# Patient Record
Sex: Male | Born: 1960 | Race: White | Hispanic: No | Marital: Married | Smoking: Current every day smoker
Health system: Southern US, Community
[De-identification: ages and names within clinical notes are randomized; demographics above are authoritative.]

## PROBLEM LIST (undated history)

## (undated) DIAGNOSIS — R5382 Chronic fatigue, unspecified: Secondary | ICD-10-CM

## (undated) DIAGNOSIS — I10 Essential (primary) hypertension: Secondary | ICD-10-CM

## (undated) DIAGNOSIS — I25118 Atherosclerotic heart disease of native coronary artery with other forms of angina pectoris: Secondary | ICD-10-CM

## (undated) DIAGNOSIS — E559 Vitamin D deficiency, unspecified: Secondary | ICD-10-CM

## (undated) DIAGNOSIS — E291 Testicular hypofunction: Secondary | ICD-10-CM

## (undated) HISTORY — DX: Chronic fatigue, unspecified: R53.82

## (undated) HISTORY — PX: KNEE SURGERY: SHX244

## (undated) HISTORY — PX: OTHER SURGICAL HISTORY: SHX169

## (undated) HISTORY — DX: Essential (primary) hypertension: I10

## (undated) HISTORY — DX: Testicular hypofunction: E29.1

## (undated) HISTORY — DX: Vitamin D deficiency, unspecified: E55.9

## (undated) HISTORY — DX: Atherosclerotic heart disease of native coronary artery with other forms of angina pectoris: I25.118

---

## 2001-01-03 ENCOUNTER — Ambulatory Visit (HOSPITAL_COMMUNITY): Admission: RE | Admit: 2001-01-03 | Discharge: 2001-01-03 | Payer: Self-pay | Admitting: Orthopedic Surgery

## 2001-01-03 ENCOUNTER — Encounter: Payer: Self-pay | Admitting: Orthopedic Surgery

## 2016-02-03 DIAGNOSIS — S82832D Other fracture of upper and lower end of left fibula, subsequent encounter for closed fracture with routine healing: Secondary | ICD-10-CM | POA: Insufficient documentation

## 2016-02-03 HISTORY — DX: Other fracture of upper and lower end of left fibula, subsequent encounter for closed fracture with routine healing: S82.832D

## 2021-02-10 DIAGNOSIS — R0602 Shortness of breath: Secondary | ICD-10-CM

## 2021-02-10 DIAGNOSIS — I10 Essential (primary) hypertension: Secondary | ICD-10-CM | POA: Diagnosis not present

## 2021-02-17 ENCOUNTER — Encounter: Payer: Self-pay | Admitting: *Deleted

## 2021-02-17 ENCOUNTER — Encounter: Payer: Self-pay | Admitting: Cardiology

## 2021-03-19 ENCOUNTER — Encounter: Payer: Self-pay | Admitting: Cardiology

## 2021-03-19 ENCOUNTER — Other Ambulatory Visit: Payer: Self-pay

## 2021-03-19 ENCOUNTER — Ambulatory Visit: Payer: BC Managed Care – PPO | Admitting: Cardiology

## 2021-03-19 VITALS — BP 144/76 | HR 87 | Ht 69.0 in | Wt 246.2 lb

## 2021-03-19 DIAGNOSIS — I700 Atherosclerosis of aorta: Secondary | ICD-10-CM | POA: Diagnosis not present

## 2021-03-19 DIAGNOSIS — J432 Centrilobular emphysema: Secondary | ICD-10-CM | POA: Diagnosis not present

## 2021-03-19 DIAGNOSIS — I2510 Atherosclerotic heart disease of native coronary artery without angina pectoris: Secondary | ICD-10-CM | POA: Diagnosis not present

## 2021-03-19 DIAGNOSIS — I10 Essential (primary) hypertension: Secondary | ICD-10-CM | POA: Diagnosis not present

## 2021-03-19 DIAGNOSIS — R0602 Shortness of breath: Secondary | ICD-10-CM

## 2021-03-19 DIAGNOSIS — M542 Cervicalgia: Secondary | ICD-10-CM

## 2021-03-19 MED ORDER — ROSUVASTATIN CALCIUM 10 MG PO TABS: 10.0000 mg | ORAL_TABLET | Freq: Every day | ORAL | 3 refills | Status: DC

## 2021-03-19 MED ORDER — NITROGLYCERIN 0.4 MG SL SUBL: 0.4000 mg | SUBLINGUAL_TABLET | SUBLINGUAL | 3 refills | Status: DC | PRN

## 2021-03-19 MED ORDER — DILTIAZEM HCL ER COATED BEADS 240 MG PO CP24: 240.0000 mg | ORAL_CAPSULE | Freq: Every day | ORAL | 3 refills | Status: DC

## 2021-03-19 MED ORDER — METOPROLOL TARTRATE 100 MG PO TABS: 100.0000 mg | ORAL_TABLET | Freq: Once | ORAL | 0 refills | Status: DC

## 2021-03-19 MED ORDER — ASPIRIN EC 81 MG PO TBEC: 81.0000 mg | DELAYED_RELEASE_TABLET | Freq: Every day | ORAL | 3 refills | Status: DC

## 2021-03-19 NOTE — Patient Instructions (Addendum)
Medication Instructions:  Your physician has recommended you make the following change in your medication:  START: Aspirin 81 mg take one tablet by mouth daily  START: Rosuvastatin 10 mg take one tablet by mouth daily.  START: Diltiazem 240 mg take one tablet by mouth daily.  START: Nitroglycerin 0.4 mg take one tablet by mouth every 5 minutes as needed for chest pain.  *If you need a refill on your cardiac medications before your next appointment, please call your pharmacy*   Lab Work: Your physician recommends that you return for lab work in: TODAY BMP, ProBNP If you have labs (blood work) drawn today and your tests are completely normal, you will receive your results only by: MyChart Message (if you have MyChart) OR A paper copy in the mail If you have any lab test that is abnormal or we need to change your treatment, we will call you to review the results.   Testing/Procedures:   Your cardiac CT will be scheduled at the below location:   [facility] [address] [place], [place] [address] [phone]  If scheduled at [facility], please arrive at the [facility] main entrance (entrance A) of [facility] 30 minutes prior to test start time. Proceed to the [facility] Radiology Department (first floor) to check-in and test prep.  Please follow these instructions carefully (unless otherwise directed):  On the Night Before the Test: Be sure to Drink plenty of water. Do not consume any caffeinated/decaffeinated beverages or chocolate 12 hours prior to your test. Do not take any antihistamines 12 hours prior to your test.  On the Day of the Test: Drink plenty of water until 1 hour prior to the test. Do not eat any food 4 hours prior to the test. You may take your regular medications prior to the test.  Take metoprolol (Lopressor) two hours prior to test.      After the Test: Drink plenty of water. After receiving IV contrast, you may  experience a mild flushed feeling. This is normal. On occasion, you may experience a mild rash up to 24 hours after the test. This is not dangerous. If this occurs, you can take Benadryl 25 mg and increase your fluid intake. If you experience trouble breathing, this can be serious. If it is severe call 911 IMMEDIATELY. If it is mild, please call our office. If you take any of these medications: Glipizide/Metformin, Avandament, Glucavance, please do not take 48 hours after completing test unless otherwise instructed.  Please allow 2-4 weeks for scheduling of routine cardiac CTs. Some insurance companies require a pre-authorization which may delay scheduling of this test.   For non-scheduling related questions, please contact the cardiac imaging nurse navigator should you have any questions/concerns: Rockwell Alexandria, Cardiac Imaging Nurse Navigator Larey Brick, Cardiac Imaging Nurse Navigator [facility] Direct Office Dial: [phone]   For scheduling needs, including cancellations and rescheduling, please call [place], [phone].    Follow-Up: At [facility], you and your health needs are our priority.  As part of our continuing mission to provide you with exceptional heart care, we have created designated Provider Care Teams.  These Care Teams include your primary Cardiologist (physician) and Advanced Practice Providers (APPs -  Physician Assistants and Nurse Practitioners) who all work together to provide you with the care you need, when you need it.  We recommend signing up for the patient portal called "MyChart".  Sign up information is provided on  this After Visit Summary.  MyChart is used to connect with patients for Virtual Visits (Telemedicine).  Patients are able to view lab/test results, encounter notes, upcoming appointments, etc.  Non-urgent messages can be sent to your provider as well.   To learn more about what you can do with MyChart, go to  [email or web address].    Your next appointment:   6 week(s)  The format for your next appointment:   In Person  Provider:   Norman Herrlich, MD   Other Instructions

## 2021-03-19 NOTE — Progress Notes (Signed)
Cardiology Office Note:    Date:  03/19/2021   ID:  Leroy Hart, DOB [birth year], MRN [id number]  PCP:  Eber Jones, NP  Cardiologist:  Norman Herrlich, MD   Referring MD: Eber Jones, NP  ASSESSMENT:    1. Coronary artery calcification seen on CAT scan   2. Atherosclerosis of aorta (HCC)   3. Primary hypertension   4. Centrilobular emphysema (HCC)   5. Exertional shortness of breath   6. Neck discomfort   7. Shortness of breath    PLAN:    In order of problems listed above:  He has symptoms of exertional shortness of breath and neck discomfort quite suggestive of angina the symptoms were progressive severe limiting and out of proportion to emphysema on CT scan unresponsive above.  He has coronary artery calcification and atherosclerosis of the aorta and I share his concern that this is anginal equivalent.  In the interim we will start aspirin and statin I will put him on a rate limiting calcium channel blocker.  Given a prescription for nitroglycerin.  We will facilitate a cardiac CTA and if he has flow-limiting stenosis will need to consider coronary angiography and revascularization.  In this case, I think a myocardial perfusion study would be helpful and I think we have the option of time before we need to refer him for urgent coronary angiography as the symptoms are severe but stable.  Calcium channel blocker also help him to achieve goals of hypertension treatment.  Next appointment 6 weeks   Medication Adjustments/Labs and Tests Ordered: Current medicines are reviewed at length with the patient today.  Concerns regarding medicines are outlined above.  Orders Placed This Encounter  Procedures   Basic metabolic panel   Pro b natriuretic peptide (BNP)    Meds ordered this encounter  Medications   aspirin EC 81 MG tablet    Sig: Take 1 tablet (81 mg total) by mouth daily. Swallow whole.    Dispense:  90 tablet    Refill:  3   rosuvastatin (CRESTOR) 10 MG  tablet    Sig: Take 1 tablet (10 mg total) by mouth daily.    Dispense:  90 tablet    Refill:  3   diltiazem (CARDIZEM CD) 240 MG 24 hr capsule    Sig: Take 1 capsule (240 mg total) by mouth daily.    Dispense:  90 capsule    Refill:  3   nitroGLYCERIN (NITROSTAT) 0.4 MG SL tablet    Sig: Place 1 tablet (0.4 mg total) under the tongue every 5 (five) minutes as needed for chest pain.    Dispense:  30 tablet    Refill:  3   metoprolol tartrate (LOPRESSOR) 100 MG tablet    Sig: Take 1 tablet (100 mg total) by mouth once for 1 dose. Take two hours prior to your cardiac CT    Dispense:  1 tablet    Refill:  0      Referred for severe exertional shortness of breath as well as neck pressure discomfort  History of Present Illness:    Leroy Hart is a 60 y.o. male who is being seen today for the evaluation of shortness of breath at the request of Eber Jones, NP.  CTA showed emphysema and nodules. [facility]ogram done at [facility]/10/2021 shows ejection fraction low normal 50 to 55% impaired relaxation normal filling pressures to the right ventricle was not well visualized the atria are normal in  size and there was no valvular abnormality seen.  He had chest CT performed 01/21/2021 pulmonary embolism protocol.  He had coronary artery and aortic calcification noted centrilobular pulmonary emphysema small scattered pulmonary nodules EKG performed 01/19/2021 showed sinus rhythm was normal independently reviewed.  He was seen in the emergency room 01/19/2021 for neck swelling heart rate was 88 respiratory rate 18 blood pressure 142/80.  CBC showed hemoglobin of 17.4 potassium 4.0 creatinine 1.00 and was placed on hydrochlorothiazide.  He has smoked for 29 years up to a pack and half per day and continues to smoke.  He has prediabetes family history of premature CAD in his father and hypertension.  He is unsure of his lipid status.  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  Since February has had a progressive increase in exercise intolerance exertional shortness of breath and discomfort in his neck.  He does have some cough and wheezing but was on improvement of bronchodilator.  He also has developed hypertension and presently takes an ARB.  The shortness of breath is very severe and it forces him to stop and rest to recover but no particular chest discomfort he had a disagreement with a neighbor and had severe shortness of breath and pressure in his neck and thought he would need to come back to the hospital again.  He suspects that he has coronary artery disease.  His wife thinks he has sleep apnea he has apneic episodes at night.  He does not have edema orthopnea palpitations or syncope.  I reviewed his testing with him. Past Medical History:  Diagnosis Date   Chronic fatigue    Closed fracture of distal end of left fibula with routine healing 02/03/2016   Essential hypertension    Testicular hypofunction    Vitamin D deficiency     Past Surgical History:  Procedure Laterality Date   DERMATOLOGY     KNEE SURGERY Right     Current Medications: Current Meds  Medication Sig   aspirin EC 81 MG tablet Take 1 tablet (81 mg total) by mouth daily. Swallow whole.   diltiazem (CARDIZEM CD) 240 MG 24 hr capsule Take 1 capsule (240 mg total) by mouth daily.   losartan (COZAAR) 50 MG tablet Take 50 mg by mouth daily.   metoprolol tartrate (LOPRESSOR) 100 MG tablet Take 1 tablet (100 mg total) by mouth once for 1 dose. Take two hours prior to your cardiac CT   nitroGLYCERIN (NITROSTAT) 0.4 MG SL tablet Place 1 tablet (0.4 mg total) under the tongue every 5 (five) minutes as needed for chest pain.   rosuvastatin (CRESTOR) 10 MG tablet Take 1 tablet (10 mg total) by mouth daily.   Vitamin D3 (VITAMIN D) 25 MCG tablet Take 1,000 Units by mouth daily.     Allergies:   Patient has no known allergies.   Social History    Socioeconomic History   Marital status: Married    Spouse name: Not on file   Number of children: Not on file   Years of education: Not on file   Highest education level: Not on file  Occupational History   Not on file  Tobacco Use   Smoking status: Every Day    Packs/day: 1.00    Years: 35.00    Pack years: 35.00    Types: Cigarettes    Passive exposure: Current   Smokeless tobacco: Never  Substance and Sexual Activity   Alcohol  use: Not Currently   Drug use: Never   Sexual activity: Not on file  Other Topics Concern   Not on file  Social History Narrative   Not on file   Social Determinants of Health   Financial Resource Strain: Not on file  Food Insecurity: Not on file  Transportation Needs: Not on file  Physical Activity: Not on file  Stress: Not on file  Social Connections: Not on file     Family History: The patient's family history includes Alzheimer's disease in his paternal grandmother; Breast cancer in his paternal grandmother; Cancer in his maternal grandfather and maternal grandmother; Coronary artery disease in his father; Emphysema in his mother; Heart attack in his paternal grandfather; Hypertension in his father.  ROS:   ROS Please see the history of present illness.     All other systems reviewed and are negative.  EKGs/Labs/Other Studies Reviewed:      Physical Exam:    VS:  BP (!) 144/76 (BP Location: Right Arm, Patient Position: Sitting)   Pulse 87   Ht 5\' 9"  (1.753 m)   Wt 246 lb 3.2 oz (111.7 kg)   SpO2 93%   BMI 36.36 kg/m     Wt Readings from Last 3 Encounters:  03/19/21 246 lb 3.2 oz (111.7 kg)  02/03/21 257 lb (116.6 kg)     GEN: Appears his age well nourished, well developed in no acute distress HEENT: Normal NECK: No JVD; No carotid bruits LYMPHATICS: No lymphadenopathy CARDIAC: RRR, no murmurs, rubs, gallops RESPIRATORY:  Clear to auscultation without rales, wheezing or rhonchi  ABDOMEN: Soft, non-tender,  non-distended MUSCULOSKELETAL:  No edema; No deformity  SKIN: Warm and dry NEUROLOGIC:  Alert and oriented x 3 PSYCHIATRIC:  Normal affect     Signed, 02/05/21, MD  03/19/2021 2:24 PM    [facility]

## 2021-03-20 LAB — BASIC METABOLIC PANEL
BUN/Creatinine Ratio: 17 (ref 9–20)
BUN: 17 mg/dL (ref 6–24)
CO2: 22 mmol/L (ref 20–29)
Calcium: 10.6 mg/dL — ABNORMAL HIGH (ref 8.7–10.2)
Chloride: 108 mmol/L — ABNORMAL HIGH (ref 96–106)
Creatinine, Ser: 0.99 mg/dL (ref 0.76–1.27)
Glucose: 103 mg/dL — ABNORMAL HIGH (ref 65–99)
Potassium: 4.4 mmol/L (ref 3.5–5.2)
Sodium: 153 mmol/L — ABNORMAL HIGH (ref 134–144)
eGFR: 89 mL/min/{1.73_m2} (ref >59)

## 2021-03-20 LAB — PRO B NATRIURETIC PEPTIDE: NT-Pro BNP: 7 pg/mL (ref 0–210)

## 2021-03-22 ENCOUNTER — Telehealth: Payer: Self-pay

## 2021-03-22 DIAGNOSIS — E870 Hyperosmolality and hypernatremia: Secondary | ICD-10-CM

## 2021-03-22 NOTE — Telephone Encounter (Signed)
-----   Message from Baldo Daub, MD sent at 03/21/2021 12:04 PM EDT ----- His serum sodium is elevated this is a very common finding I suspect collaborator and we should repeat his BMP.

## 2021-03-22 NOTE — Telephone Encounter (Signed)
Spoke with patient regarding results and recommendation.  Patient verbalizes understanding and is agreeable to plan of care. Advised patient to call back with any issues or concerns.  

## 2021-03-23 DIAGNOSIS — J432 Centrilobular emphysema: Secondary | ICD-10-CM

## 2021-03-23 DIAGNOSIS — R0609 Other forms of dyspnea: Secondary | ICD-10-CM | POA: Insufficient documentation

## 2021-03-23 DIAGNOSIS — F17210 Nicotine dependence, cigarettes, uncomplicated: Secondary | ICD-10-CM | POA: Insufficient documentation

## 2021-03-23 HISTORY — DX: Centrilobular emphysema: J43.2

## 2021-03-23 HISTORY — DX: Other forms of dyspnea: R06.09

## 2021-03-23 HISTORY — DX: Nicotine dependence, cigarettes, uncomplicated: F17.210

## 2021-03-26 LAB — BASIC METABOLIC PANEL
BUN/Creatinine Ratio: 19 (ref 9–20)
BUN: 16 mg/dL (ref 6–24)
CO2: 21 mmol/L (ref 20–29)
Calcium: 9.9 mg/dL (ref 8.7–10.2)
Chloride: 106 mmol/L (ref 96–106)
Creatinine, Ser: 0.86 mg/dL (ref 0.76–1.27)
Glucose: 102 mg/dL — ABNORMAL HIGH (ref 65–99)
Potassium: 4 mmol/L (ref 3.5–5.2)
Sodium: 140 mmol/L (ref 134–144)
eGFR: 101 mL/min/{1.73_m2} (ref >59)

## 2021-03-29 ENCOUNTER — Telehealth: Payer: Self-pay

## 2021-03-29 NOTE — Telephone Encounter (Signed)
Spoke with patient regarding results and recommendation.  Patient verbalizes understanding and is agreeable to plan of care. Advised patient to call back with any issues or concerns.  

## 2021-03-29 NOTE — Telephone Encounter (Signed)
-----   Message from Baldo Daub, MD sent at 03/28/2021 12:45 PM EDT ----- Normal or stable result  No changes

## 2021-04-22 ENCOUNTER — Telehealth: Payer: Self-pay | Admitting: Cardiology

## 2021-04-22 DIAGNOSIS — I2510 Atherosclerotic heart disease of native coronary artery without angina pectoris: Secondary | ICD-10-CM

## 2021-04-22 NOTE — Telephone Encounter (Signed)
Order has been placed and message was sent to scheduling to call the patient to schedule.

## 2021-04-22 NOTE — Telephone Encounter (Signed)
Patient states he is supposed to have a CT, but has not heard anything about scheduling one and it has been 3 weeks. I did not see an order for the test.

## 2021-04-28 ENCOUNTER — Telehealth (HOSPITAL_COMMUNITY): Payer: Self-pay | Admitting: Emergency Medicine

## 2021-04-28 NOTE — Telephone Encounter (Signed)
Reaching out to patient to offer assistance regarding upcoming cardiac imaging study; pt verbalizes understanding of appt date/time, parking situation and where to check in, pre-test NPO status and medications ordered, and verified current allergies; name and call back number provided for further questions should they arise Liddy Deam RN Navigator Cardiac Imaging [facility] [phone] office [phone] cell   Denies iv issues Denies claustro 100mg metoprolol   

## 2021-04-30 ENCOUNTER — Ambulatory Visit (HOSPITAL_COMMUNITY)
Admission: RE | Admit: 2021-04-30 | Discharge: 2021-04-30 | Disposition: A | Discharge status: Home / Self Care | Payer: BC Managed Care – PPO | Source: Ambulatory Visit | Attending: Cardiology | Admitting: Cardiology

## 2021-04-30 ENCOUNTER — Other Ambulatory Visit: Payer: Self-pay

## 2021-04-30 DIAGNOSIS — Z006 Encounter for examination for normal comparison and control in clinical research program: Secondary | ICD-10-CM

## 2021-04-30 DIAGNOSIS — I2510 Atherosclerotic heart disease of native coronary artery without angina pectoris: Secondary | ICD-10-CM

## 2021-04-30 IMAGING — CT CT HEART MORP W/ CTA COR W/ SCORE W/ CA W/CM &/OR W/O CM
1 of 10 series · 9 of 20 positions shown, 12 images · IV contrast (APPLIED)
Comparison: [DATE] CTA chest
COMPARISON: [DATE] CTA chest

Addendum:
EXAM:
OVER-READ INTERPRETATION  CT CHEST

The following report is an over-read performed by radiologist Dr.
KML [REDACTED] on [DATE]. This over-read
does not include interpretation of cardiac or coronary anatomy or
pathology. The coronary CTA interpretation by the cardiologist is
attached.
CLINICAL DATA: Atypical chestpain
Cardiac/Coronary  CTA
TECHNIQUE: The patient was scanned on a Phillips Force scanner.

[Series 12: multiphase ts · axial · 0.39mm/px · z∈[+1175,+1269]mm · 9 of 2930 slices shown, 12 images]
[im 293/2930  vessel]
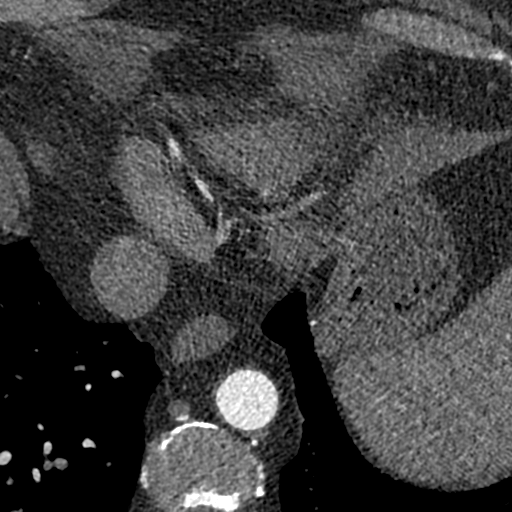
[im 293/2930  lung]
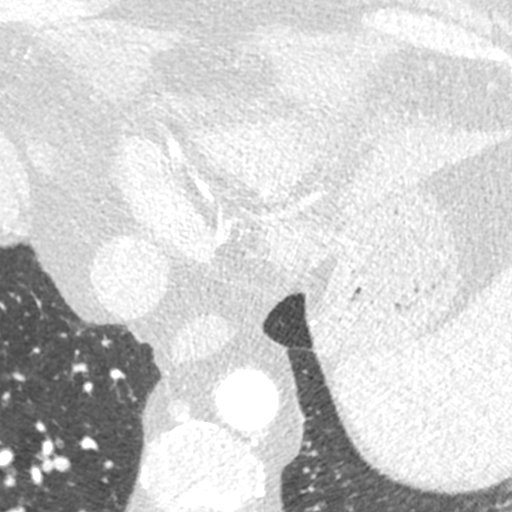
[im 586/2930  vessel]
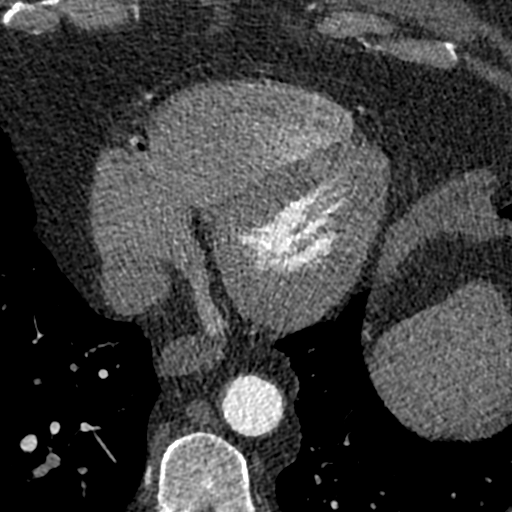
[im 879/2930  vessel]
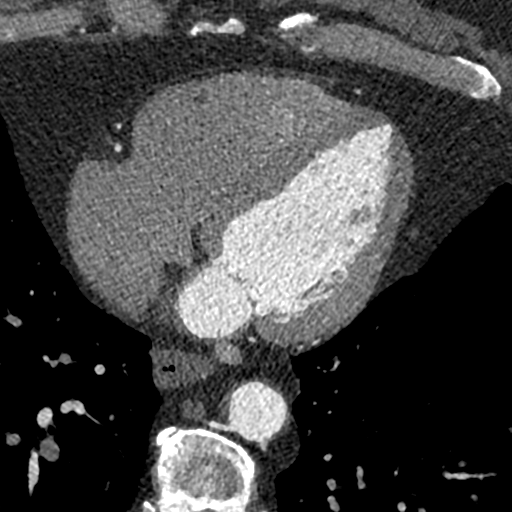
[im 1172/2930  vessel]
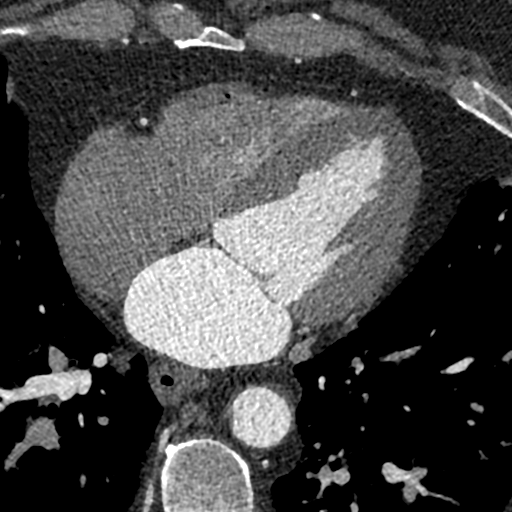
[im 1465/2930  vessel]
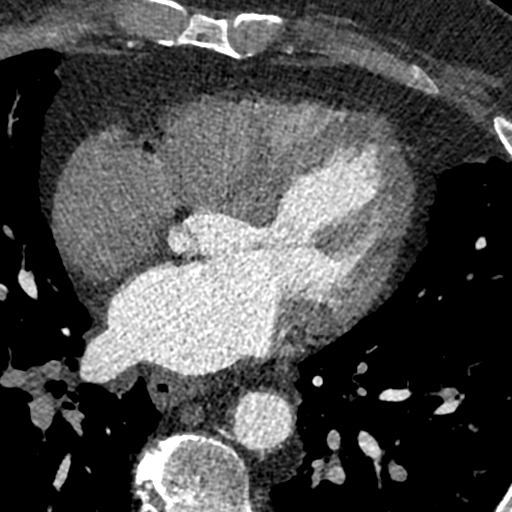
[im 1465/2930  lung]
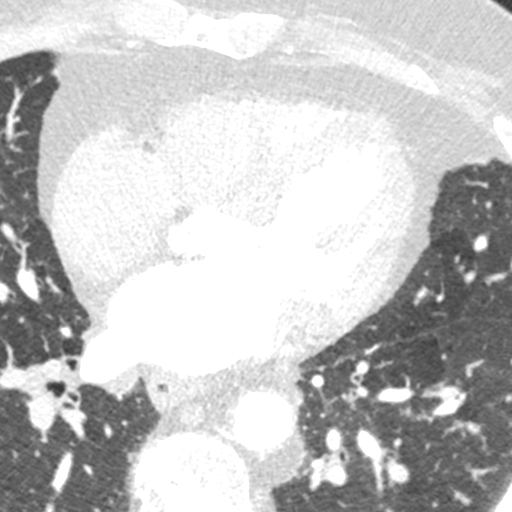
[im 1758/2930  vessel]
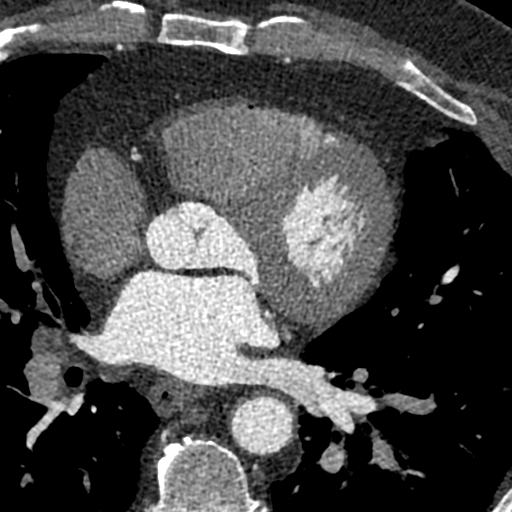
[im 2051/2930  vessel]
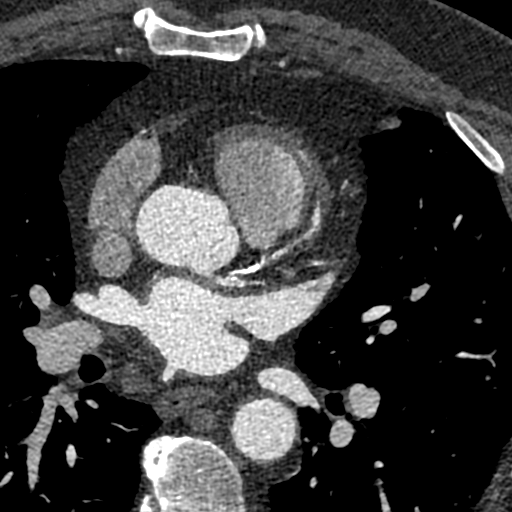
[im 2344/2930  vessel]
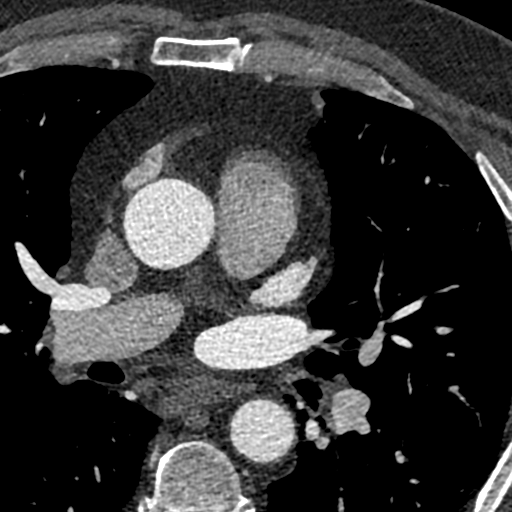
[im 2637/2930  vessel]
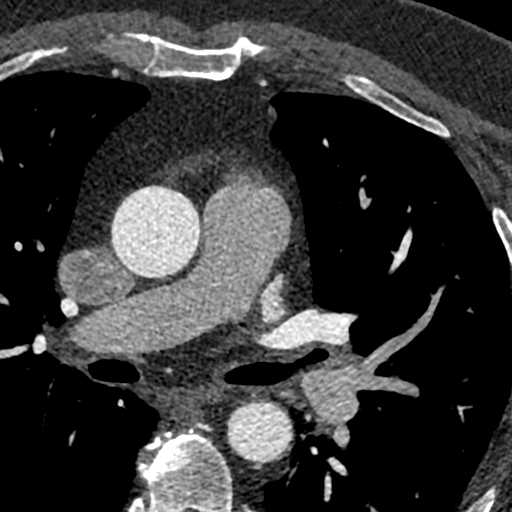
[im 2637/2930  lung]
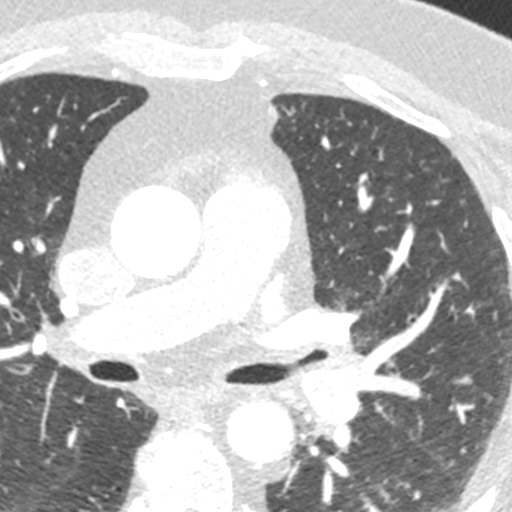

[9 of 20 positions shown; findings below may reference images not displayed]

FINDINGS: Vascular: Aortic atherosclerosis. No central pulmonary embolism, on
this non-dedicated study.

Mediastinum/Nodes: No imaged thoracic adenopathy.

Lungs/Pleura: No pleural fluid. No lobar consolidation. Bilateral
subpleural predominant pulmonary nodules including up to 6 mm are
similar back to [DATE].

Upper Abdomen: Normal imaged portions of the liver, spleen, stomach.

Musculoskeletal: No acute osseous abnormality.
IMPRESSION: 1.  No acute findings in the imaged extracardiac chest.
2.  Aortic Atherosclerosis ([NS]-[NS]).
3. Bilateral pulmonary nodules are subpleural predominant and
similar back for 3 months. Per Fleischner criteria, follow-up chest
CT at 15-21 months is considered optional for low risk patients, but
recommended for high risk patients. This recommendation follows the
consensus statement: Guidelines for Management of Incidental
Pulmonary Nodules Detected on CT Images:From the [HOSPITAL]
[NS]; published online before print (10.1148/radiol.[PHONE_NUMBER]).
FINDINGS: A 120 kV prospective scan was triggered in the descending thoracic
aorta at 111 HU's. Axial non-contrast 3 mm slices were carried out
through the heart. The data set was analyzed on a dedicated work
station and scored using the Agatson method. Gantry rotation speed
was 250 msecs and collimation was .6 mm. No beta blockade and 0.8 mg
of sl NTG was given. The 3D data set was reconstructed in 5%
intervals of the 67-82 % of the R-R cycle. Diastolic phases were
analyzed on a dedicated work station using MPR, MIP and VRT modes.
The patient received 80 cc of contrast.

Aorta: Mildly enlarged ascending aorta - 38 mm. No calcifications.
No dissection.

Aortic Valve:  Trileaflet.  No calcifications.

Coronary Arteries:  Normal coronary origin.  Right dominance.

RCA is a large dominant artery that gives rise to PDA and PLA. There
is soft moderate (50-70% stenosis), plaque in the mid portion of RCA

Left main is a moderate size artery that gives rise to LAD, LCX
arteries as well as to small intermediate branch.

LAD is a large vessel. There is a calcified plaque that extends into
left main coronary artery. This plaque create moderate stenosis
(50-70%) of the proximal portion of the LAD. In the mid portion of
LAD small minimal (0-24% stenosis) non obstructive calcified plaque
is noted. LAD gives rise to moderate size D1. D1 has mild calcified,
non obstructive plaques. Small D2 is noted and is free of disease.

LCX is a non-dominant artery that gives rise to moderate size OM1
branch as well as moderate size OM2. There are soft, mild (25-49%)
non obstructive plaques noted in mid portion of KML

Other findings:

Normal pulmonary vein drainage into the left atrium.

Normal left atrial appendage without a thrombus.

Normal size of the pulmonary artery.
IMPRESSION: 1. Coronary calcium score: Not performed.

2. Normal coronary origin with right dominance.

3. CAD-RADS 3. Moderate stenosis. Consider symptom-guided
anti-ischemic pharmacotherapy as well as risk factor modification
per guideline directed care. Additional analysis with CT FFR will be
submitted.

*** End of Addendum ***
EXAM:
OVER-READ INTERPRETATION  CT CHEST

The following report is an over-read performed by radiologist Dr.
KML [REDACTED] on [DATE]. This over-read
does not include interpretation of cardiac or coronary anatomy or
pathology. The coronary CTA interpretation by the cardiologist is
attached.
FINDINGS: Vascular: Aortic atherosclerosis. No central pulmonary embolism, on
this non-dedicated study.

Mediastinum/Nodes: No imaged thoracic adenopathy.

Lungs/Pleura: No pleural fluid. No lobar consolidation. Bilateral
subpleural predominant pulmonary nodules including up to 6 mm are
similar back to [DATE].

Upper Abdomen: Normal imaged portions of the liver, spleen, stomach.

Musculoskeletal: No acute osseous abnormality.
IMPRESSION: 1.  No acute findings in the imaged extracardiac chest.
2.  Aortic Atherosclerosis ([NS]-[NS]).
3. Bilateral pulmonary nodules are subpleural predominant and
similar back for 3 months. Per Fleischner criteria, follow-up chest
CT at 15-21 months is considered optional for low risk patients, but
recommended for high risk patients. This recommendation follows the
consensus statement: Guidelines for Management of Incidental
Pulmonary Nodules Detected on CT Images:From the [HOSPITAL]
[NS]; published online before print (10.1148/radiol.[PHONE_NUMBER]).

## 2021-04-30 MED ORDER — IOHEXOL 350 MG/ML SOLN
95.0000 mL | Freq: Once | INTRAVENOUS | Status: AC | PRN
  Administered 2021-04-30: 95 mL via INTRAVENOUS

## 2021-04-30 MED ORDER — METOPROLOL TARTRATE 5 MG/5ML IV SOLN
10.0000 mg | INTRAVENOUS | Status: AC | PRN
Start: 2021-04-30 — End: 2021-04-30
  Administered 2021-04-30: 10 mg via INTRAVENOUS

## 2021-04-30 MED ORDER — NITROGLYCERIN 0.4 MG SL SUBL
0.8000 mg | SUBLINGUAL_TABLET | Freq: Once | SUBLINGUAL | Status: AC
  Administered 2021-04-30: 0.8 mg via SUBLINGUAL

## 2021-04-30 MED ORDER — METOPROLOL TARTRATE 5 MG/5ML IV SOLN
INTRAVENOUS | Status: AC
  Administered 2021-04-30: 10 mg via INTRAVENOUS
  Filled 2021-04-30: qty 20

## 2021-04-30 MED ORDER — NITROGLYCERIN 0.4 MG SL SUBL
SUBLINGUAL_TABLET | SUBLINGUAL | Status: AC
  Filled 2021-04-30: qty 2

## 2021-04-30 NOTE — Research (Signed)
IDENTIFY Informed Consent                  Subject Name:Leroy Hart   Subject met inclusion and exclusion criteria.  The informed consent form, study requirements and expectations were reviewed with the subject and questions and concerns were addressed prior to the signing of the consent form.  The subject verbalized understanding of the trial requirements.  The subject agreed to participate in the IDENTIFY trial and signed the informed consent at 11:17AM on 04/30/21.  The informed consent was obtained prior to performance of any protocol-specific procedures for the subject.  A copy of the signed informed consent was given to the subject and a copy was placed in the subject's medical record.    Brenton Grills, Research Assistant

## 2021-05-03 ENCOUNTER — Other Ambulatory Visit (HOSPITAL_COMMUNITY): Payer: Self-pay | Admitting: Emergency Medicine

## 2021-05-03 ENCOUNTER — Telehealth: Payer: Self-pay

## 2021-05-03 ENCOUNTER — Ambulatory Visit (HOSPITAL_COMMUNITY)
Admission: RE | Admit: 2021-05-03 | Discharge: 2021-05-03 | Disposition: A | Discharge status: Home / Self Care | Payer: BC Managed Care – PPO | Source: Ambulatory Visit | Attending: Internal Medicine | Admitting: Internal Medicine

## 2021-05-03 DIAGNOSIS — R931 Abnormal findings on diagnostic imaging of heart and coronary circulation: Secondary | ICD-10-CM | POA: Diagnosis present

## 2021-05-03 DIAGNOSIS — I2510 Atherosclerotic heart disease of native coronary artery without angina pectoris: Secondary | ICD-10-CM | POA: Diagnosis not present

## 2021-05-03 DIAGNOSIS — R079 Chest pain, unspecified: Secondary | ICD-10-CM | POA: Diagnosis present

## 2021-05-03 NOTE — Telephone Encounter (Signed)
Spoke with patient regarding results and recommendation.  Patient verbalizes understanding and is agreeable to plan of care. Advised patient to call back with any issues or concerns.  

## 2021-05-03 NOTE — Telephone Encounter (Signed)
-----   Message from Baldo Daub, MD sent at 05/02/2021 11:45 AM EDT ----- He is on the right medications  We were correct that he had blockages of his arteries  I really need to see the results of the second set of images FFR and then make a decision whether we should do heart catheterization or continue medications.  We can discuss further at office follow-up.

## 2021-05-04 DIAGNOSIS — G4733 Obstructive sleep apnea (adult) (pediatric): Secondary | ICD-10-CM | POA: Insufficient documentation

## 2021-05-04 HISTORY — DX: Obstructive sleep apnea (adult) (pediatric): G47.33

## 2021-05-06 ENCOUNTER — Ambulatory Visit: Payer: BC Managed Care – PPO | Admitting: Cardiology

## 2021-05-06 ENCOUNTER — Other Ambulatory Visit: Payer: Self-pay

## 2021-05-06 ENCOUNTER — Encounter: Payer: Self-pay | Admitting: Cardiology

## 2021-05-06 VITALS — BP 148/80 | HR 86 | Ht 69.0 in | Wt 244.6 lb

## 2021-05-06 DIAGNOSIS — J432 Centrilobular emphysema: Secondary | ICD-10-CM | POA: Diagnosis not present

## 2021-05-06 DIAGNOSIS — I10 Essential (primary) hypertension: Secondary | ICD-10-CM | POA: Diagnosis not present

## 2021-05-06 DIAGNOSIS — I25118 Atherosclerotic heart disease of native coronary artery with other forms of angina pectoris: Secondary | ICD-10-CM | POA: Diagnosis not present

## 2021-05-06 NOTE — Patient Instructions (Signed)
Medication Instructions:  Your physician recommends that you continue on your current medications as directed. Please refer to the Current Medication list given to you today.  *If you need a refill on your cardiac medications before your next appointment, please call your pharmacy*   Lab Work: Your physician recommends that you return for lab work in: TODAY CBC, BMP If you have labs (blood work) drawn today and your tests are completely normal, you will receive your results only by: MyChart Message (if you have MyChart) OR A paper copy in the mail If you have any lab test that is abnormal or we need to change your treatment, we will call you to review the results.   Testing/Procedures:  [facility] AT [place] [address] [place] [address] Dept: [phone] Loc: [phone]  Leroy Hart  05/06/2021  You are scheduled for a Cardiac Catheterization on Monday, September 19 with Dr. Bryan Lemma.  1. Please arrive at the [facility] (Main Entrance A) at [facility]: [address] [place], [place] [address] at 5:30 AM (This time is two hours before your procedure to ensure your preparation). Free valet parking service is available.   Special note: Every effort is made to have your procedure done on time. Please understand that emergencies sometimes delay scheduled procedures.  2. Diet: Do not eat solid foods after midnight.  The patient may have clear liquids until 5am upon the day of the procedure.  3. Labs: YOU HAD YOUR LABS DRAWN TODAY 05/06/2021  4. Medication instructions in preparation for your procedure:   Contrast Allergy: No  On the morning of your procedure, take your Aspirin and any morning medicines NOT listed above.  You may use sips of water.  5. Plan for one night stay--bring personal belongings. 6. Bring a current list of your medications and current insurance  cards. 7. You MUST have a responsible person to drive you home. 8. Someone MUST be with you the first 24 hours after you arrive home or your discharge will be delayed. 9. Please wear clothes that are easy to get on and off and wear slip-on shoes.  Thank you for allowing [place] to care for you!   --  Invasive Cardiovascular services    Follow-Up: At [facility], you and your health needs are our priority.  As part of our continuing mission to provide you with exceptional heart care, we have created designated Provider Care Teams.  These Care Teams include your primary Cardiologist (physician) and Advanced Practice Providers (APPs -  Physician Assistants and Nurse Practitioners) who all work together to provide you with the care you need, when you need it.  We recommend signing up for the patient portal called "MyChart".  Sign up information is provided on this After Visit Summary.  MyChart is used to connect with patients for Virtual Visits (Telemedicine).  Patients are able to view lab/test results, encounter notes, upcoming appointments, etc.  Non-urgent messages can be sent to your provider as well.   To learn more about what you can do with MyChart, go to [email or web address].    Your next appointment:   4 week(s)  The format for your next appointment:   In Person  Provider:   Norman Herrlich, MD   Other Instructions

## 2021-05-06 NOTE — H&P (View-Only) (Signed)
Cardiology Office Note:    Date:  05/06/2021   ID:  Leroy Hart, DOB [birth year], MRN [id number]  PCP:  Eber Jones, NP  Cardiologist:  Norman Herrlich, MD    Referring MD: Eber Jones, NP    ASSESSMENT:    1. Coronary artery disease of native artery of native heart with stable angina pectoris (HCC)   2. Primary hypertension   3. Centrilobular emphysema (HCC)    PLAN:    In order of problems listed above:  Cardiac CTA is abnormal abnormal FFR right coronary lesion he is having symptoms of angina neck discomfort anginal equivalent shortness of breath out of proportion to his COPD and after discussion with patient benefits risks and options elects undergo coronary intervention if appropriate and coronary angiography.  He has no dye allergy no contraindication to dual antiplatelet therapy and is compliant with medications. Stable continue current treatment Continue lipid-lowering treatment   Next appointment: 4 weeks   Medication Adjustments/Labs and Tests Ordered: Current medicines are reviewed at length with the patient today.  Concerns regarding medicines are outlined above.  Orders Placed This Encounter  Procedures   Basic metabolic panel   CBC   EKG 12-Lead   No orders of the defined types were placed in this encounter.   Chief Complaint  Patient presents with   Follow-up   Coronary Artery Disease     History of Present Illness:    Leroy Hart is a 60 y.o. male with a hx of coronary artery calcification on CT scan ongoing cigarette smoking family history of premature CAD last seen 03/19/2021 with symptoms of exertional shortness of breath and neck discomfort anginal in nature out of proportion to emphysema seen on CT scan.  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  At home when he walks to his [place] [address] slightly so short of breath he is sit down to rest 5 to 10 minutes to recover.  His previous exertional neck discomfort is not occurring at this time he is compliant with medications including aspirin rate limiting calcium channel blocker and his high intensity statin.  He has no edema orthopnea palpitation or syncope.  The proBNP level was low at 7. Cardiac CTA was abnormal and calcium score was not performed.  He had stenosis present right coronary artery 50 to 70% stenosis in the midportion LAD 50 to 70% proximal stenosis left circumflex 25 to 49% stenosis.  With concerns of flow-limiting stenosis FFR was performed and the lesion present in the midportion of the right coronary artery have borderline characteristics with FFR of 0.79 and a recommendation if symptomatic to undergo coronary angiography.  Previous testing at [facility]  CTA showed emphysema and nodules. Echocardiogram done at [facility] 02/10/2021 shows ejection fraction low normal 50 to 55% impaired relaxation normal filling pressures to the right ventricle was not well visualized the atria are normal in size and there was no valvular abnormality seen.  He had chest CT performed 01/21/2021 pulmonary embolism protocol.  He had coronary artery and aortic calcification noted centrilobular pulmonary emphysema small scattered pulmonary nodules   Past Surgical History:  Procedure Laterality Date   DERMATOLOGY     KNEE SURGERY Right     Current  Medications: Current Meds  Medication Sig   albuterol (VENTOLIN HFA) 108 (90 Base) MCG/ACT inhaler Inhale into the lungs every 6 (six) hours as needed for wheezing or shortness of breath.   aspirin EC 81 MG tablet Take 1 tablet (81 mg total) by mouth daily. Swallow whole.   diltiazem (CARDIZEM CD) 240 MG 24 hr capsule Take 1 capsule (240 mg total) by mouth daily.    losartan (COZAAR) 50 MG tablet Take 50 mg by mouth daily.   nitroGLYCERIN (NITROSTAT) 0.4 MG SL tablet Place 1 tablet (0.4 mg total) under the tongue every 5 (five) minutes as needed for chest pain.   rosuvastatin (CRESTOR) 10 MG tablet Take 1 tablet (10 mg total) by mouth daily.   Vitamin D3 (VITAMIN D) 25 MCG tablet Take 1,000 Units by mouth daily.     Allergies:   Patient has no known allergies.   Social History   Socioeconomic History   Marital status: Married    Spouse name: Not on file   Number of children: Not on file   Years of education: Not on file   Highest education level: Not on file  Occupational History   Not on file  Tobacco Use   Smoking status: Every Day    Packs/day: 1.00    Years: 35.00    Pack years: 35.00    Types: Cigarettes    Passive exposure: Current   Smokeless tobacco: Never  Substance and Sexual Activity   Alcohol use: Not Currently   Drug use: Never   Sexual activity: Not on file  Other Topics Concern   Not on file  Social History Narrative   Not on file   Social Determinants of Health   Financial Resource Strain: Not on file  Food Insecurity: Not on file  Transportation Needs: Not on file  Physical Activity: Not on file  Stress: Not on file  Social Connections: Not on file     Family History: The patient's family history includes Alzheimer's disease in his paternal grandmother; Breast cancer in his paternal grandmother; Cancer in his maternal grandfather and maternal grandmother; Coronary artery disease in his father; Emphysema in his mother; Heart attack in his paternal grandfather; Hypertension in his father. ROS:   Please see the history of present illness.    All other systems reviewed and are negative.  EKGs/Labs/Other Studies Reviewed:    The following studies were reviewed today:    Recent Labs: 03/19/2021: NT-Pro BNP 7 03/26/2021: BUN 16; Creatinine, Ser 0.86; Potassium 4.0; Sodium 140  Recent Lipid Panel No results  found for: CHOL, TRIG, HDL, CHOLHDL, VLDL, LDLCALC, LDLDIRECT  Physical Exam:    VS:  BP (!) 148/80   Pulse 86   Ht 5\' 9"  (1.753 m)   Wt 244 lb 9.6 oz (110.9 kg)   SpO2 95%   BMI 36.12 kg/m     Wt Readings from Last 3 Encounters:  05/06/21 244 lb 9.6 oz (110.9 kg)  03/19/21 246 lb 3.2 oz (111.7 kg)  02/03/21 257 lb (116.6 kg)     GEN: He appears his age well nourished, well developed in no acute distress HEENT: Normal NECK: No JVD; No carotid bruits LYMPHATICS: No lymphadenopathy CARDIAC: Barrel chest with diminished breath sounds RRR, no murmurs, rubs, gallops RESPIRATORY:  Clear to auscultation without rales, wheezing or rhonchi  ABDOMEN: Soft, non-tender, non-distended MUSCULOSKELETAL:  No edema; No deformity  SKIN: Warm and dry NEUROLOGIC:  Alert and oriented x 3 PSYCHIATRIC:  Normal affect  Signed, Norman Herrlich, MD  05/06/2021 8:21 AM    [facility]

## 2021-05-06 NOTE — Progress Notes (Signed)
Cardiology Office Note:    Date:  05/06/2021   ID:  Leroy Hart, DOB 12/27/1960, MRN [id number]  PCP:  Inman, Joanna R, NP  Cardiologist:  Deavon Podgorski, MD    Referring MD: Inman, Joanna R, NP    ASSESSMENT:    1. Coronary artery disease of native artery of native heart with stable angina pectoris (HCC)   2. Primary hypertension   3. Centrilobular emphysema (HCC)    PLAN:    In order of problems listed above:  Cardiac CTA is abnormal abnormal FFR right coronary lesion he is having symptoms of angina neck discomfort anginal equivalent shortness of breath out of proportion to his COPD and after discussion with patient benefits risks and options elects undergo coronary intervention if appropriate and coronary angiography.  He has no dye allergy no contraindication to dual antiplatelet therapy and is compliant with medications. Stable continue current treatment Continue lipid-lowering treatment   Next appointment: 4 weeks   Medication Adjustments/Labs and Tests Ordered: Current medicines are reviewed at length with the patient today.  Concerns regarding medicines are outlined above.  Orders Placed This Encounter  Procedures   Basic metabolic panel   CBC   EKG 12-Lead   No orders of the defined types were placed in this encounter.   Chief Complaint  Patient presents with   Follow-up   Coronary Artery Disease     History of Present Illness:    Leroy Hart is a 59 y.o. male with a hx of coronary artery calcification on CT scan ongoing cigarette smoking family history of premature CAD last seen 03/19/2021 with symptoms of exertional shortness of breath and neck discomfort anginal in nature out of proportion to emphysema seen on CT scan.  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  At home when he walks to his [place] slightly so short of breath he is sit down to rest 5 to 10 minutes to recover.  His previous exertional neck discomfort is not occurring at this time he is compliant with medications including aspirin rate limiting calcium channel blocker and his high intensity statin.  He has no edema orthopnea palpitation or syncope.  The proBNP level was low at 7. Cardiac CTA was abnormal and calcium score was not performed.  He had stenosis present right coronary artery 50 to 70% stenosis in the midportion LAD 50 to 70% proximal stenosis left circumflex 25 to 49% stenosis.  With concerns of flow-limiting stenosis FFR was performed and the lesion present in the midportion of the right coronary artery have borderline characteristics with FFR of 0.79 and a recommendation if symptomatic to undergo coronary angiography.  Previous testing at [facility]  CTA showed emphysema and nodules. Echocardiogram done at [facility] 02/10/2021 shows ejection fraction low normal 50 to 55% impaired relaxation normal filling pressures to the right ventricle was not well visualized the atria are normal in size and there was no valvular abnormality seen.  He had chest CT performed 01/21/2021 pulmonary embolism protocol.  He had coronary artery and aortic calcification noted centrilobular pulmonary emphysema small scattered pulmonary nodules   Past Surgical History:  Procedure Laterality Date   DERMATOLOGY     KNEE SURGERY Right     Current   Medications: Current Meds  Medication Sig   albuterol (VENTOLIN HFA) 108 (90 Base) MCG/ACT inhaler Inhale into the lungs every 6 (six) hours as needed for wheezing or shortness of breath.   aspirin EC 81 MG tablet Take 1 tablet (81 mg total) by mouth daily. Swallow whole.   diltiazem (CARDIZEM CD) 240 MG 24 hr capsule Take 1 capsule (240 mg total) by mouth daily.    losartan (COZAAR) 50 MG tablet Take 50 mg by mouth daily.   nitroGLYCERIN (NITROSTAT) 0.4 MG SL tablet Place 1 tablet (0.4 mg total) under the tongue every 5 (five) minutes as needed for chest pain.   rosuvastatin (CRESTOR) 10 MG tablet Take 1 tablet (10 mg total) by mouth daily.   Vitamin D3 (VITAMIN D) 25 MCG tablet Take 1,000 Units by mouth daily.     Allergies:   Patient has no known allergies.   Social History   Socioeconomic History   Marital status: Married    Spouse name: Not on file   Number of children: Not on file   Years of education: Not on file   Highest education level: Not on file  Occupational History   Not on file  Tobacco Use   Smoking status: Every Day    Packs/day: 1.00    Years: 35.00    Pack years: 35.00    Types: Cigarettes    Passive exposure: Current   Smokeless tobacco: Never  Substance and Sexual Activity   Alcohol use: Not Currently   Drug use: Never   Sexual activity: Not on file  Other Topics Concern   Not on file  Social History Narrative   Not on file   Social Determinants of Health   Financial Resource Strain: Not on file  Food Insecurity: Not on file  Transportation Needs: Not on file  Physical Activity: Not on file  Stress: Not on file  Social Connections: Not on file     Family History: The patient's family history includes Alzheimer's disease in his paternal grandmother; Breast cancer in his paternal grandmother; Cancer in his maternal grandfather and maternal grandmother; Coronary artery disease in his father; Emphysema in his mother; Heart attack in his paternal grandfather; Hypertension in his father. ROS:   Please see the history of present illness.    All other systems reviewed and are negative.  EKGs/Labs/Other Studies Reviewed:    The following studies were reviewed today:    Recent Labs: 03/19/2021: NT-Pro BNP 7 03/26/2021: BUN 16; Creatinine, Ser 0.86; Potassium 4.0; Sodium 140  Recent Lipid Panel No results  found for: CHOL, TRIG, HDL, CHOLHDL, VLDL, LDLCALC, LDLDIRECT  Physical Exam:    VS:  BP (!) 148/80   Pulse 86   Ht 5\' 9"  (1.753 m)   Wt 244 lb 9.6 oz (110.9 kg)   SpO2 95%   BMI 36.12 kg/m     Wt Readings from Last 3 Encounters:  05/06/21 244 lb 9.6 oz (110.9 kg)  03/19/21 246 lb 3.2 oz (111.7 kg)  02/03/21 257 lb (116.6 kg)     GEN: He appears his age well nourished, well developed in no acute distress HEENT: Normal NECK: No JVD; No carotid bruits LYMPHATICS: No lymphadenopathy CARDIAC: Barrel chest with diminished breath sounds RRR, no murmurs, rubs, gallops RESPIRATORY:  Clear to auscultation without rales, wheezing or rhonchi  ABDOMEN: Soft, non-tender, non-distended MUSCULOSKELETAL:  No edema; No deformity  SKIN: Warm and dry NEUROLOGIC:  Alert and oriented x 3 PSYCHIATRIC:  Normal affect  Signed, Britny Riel, MD  05/06/2021 8:21 AM    [facility]   

## 2021-05-07 LAB — LIPID PANEL
Chol/HDL Ratio: 4.7 ratio (ref 0.0–5.0)
Cholesterol, Total: 159 mg/dL (ref 100–199)
HDL: 34 mg/dL — ABNORMAL LOW (ref >39)
LDL Chol Calc (NIH): 71 mg/dL (ref 0–99)
Triglycerides: 340 mg/dL — ABNORMAL HIGH (ref 0–149)
VLDL Cholesterol Cal: 54 mg/dL — ABNORMAL HIGH (ref 5–40)

## 2021-05-07 LAB — BASIC METABOLIC PANEL
BUN/Creatinine Ratio: 17 (ref 9–20)
BUN: 17 mg/dL (ref 6–24)
CO2: 23 mmol/L (ref 20–29)
Calcium: 10.6 mg/dL — ABNORMAL HIGH (ref 8.7–10.2)
Chloride: 102 mmol/L (ref 96–106)
Creatinine, Ser: 0.99 mg/dL (ref 0.76–1.27)
Glucose: 99 mg/dL (ref 65–99)
Potassium: 4.5 mmol/L (ref 3.5–5.2)
Sodium: 141 mmol/L (ref 134–144)
eGFR: 89 mL/min/{1.73_m2} (ref >59)

## 2021-05-07 LAB — CBC
Hematocrit: 48 % (ref 37.5–51.0)
Hemoglobin: 16.1 g/dL (ref 13.0–17.7)
MCH: 27.9 pg (ref 26.6–33.0)
MCHC: 33.5 g/dL (ref 31.5–35.7)
MCV: 83 fL (ref 79–97)
Platelets: 141 10*3/uL — ABNORMAL LOW (ref 150–450)
RBC: 5.77 x10E6/uL (ref 4.14–5.80)
RDW: 16.3 % — ABNORMAL HIGH (ref 11.6–15.4)
WBC: 9.8 10*3/uL (ref 3.4–10.8)

## 2021-05-10 ENCOUNTER — Encounter (HOSPITAL_COMMUNITY): Payer: Self-pay | Admitting: Cardiology

## 2021-05-10 ENCOUNTER — Other Ambulatory Visit: Payer: Self-pay

## 2021-05-10 ENCOUNTER — Other Ambulatory Visit (HOSPITAL_COMMUNITY): Payer: Self-pay

## 2021-05-10 ENCOUNTER — Encounter (HOSPITAL_COMMUNITY)
Admission: RE | Disposition: A | Discharge status: Home / Self Care | Payer: Self-pay | Source: Home / Self Care | Attending: Cardiology

## 2021-05-10 ENCOUNTER — Ambulatory Visit (HOSPITAL_COMMUNITY)
Admission: RE | Admit: 2021-05-10 | Discharge: 2021-05-10 | Disposition: A | Discharge status: Home / Self Care | Payer: BC Managed Care – PPO | Attending: Cardiology | Admitting: Cardiology

## 2021-05-10 ENCOUNTER — Telehealth: Payer: Self-pay

## 2021-05-10 DIAGNOSIS — I209 Angina pectoris, unspecified: Secondary | ICD-10-CM

## 2021-05-10 DIAGNOSIS — I10 Essential (primary) hypertension: Secondary | ICD-10-CM | POA: Insufficient documentation

## 2021-05-10 DIAGNOSIS — R931 Abnormal findings on diagnostic imaging of heart and coronary circulation: Secondary | ICD-10-CM

## 2021-05-10 DIAGNOSIS — Z79899 Other long term (current) drug therapy: Secondary | ICD-10-CM | POA: Insufficient documentation

## 2021-05-10 DIAGNOSIS — Z95820 Peripheral vascular angioplasty status with implants and grafts: Secondary | ICD-10-CM

## 2021-05-10 DIAGNOSIS — E785 Hyperlipidemia, unspecified: Secondary | ICD-10-CM | POA: Insufficient documentation

## 2021-05-10 DIAGNOSIS — Z8249 Family history of ischemic heart disease and other diseases of the circulatory system: Secondary | ICD-10-CM | POA: Insufficient documentation

## 2021-05-10 DIAGNOSIS — I25118 Atherosclerotic heart disease of native coronary artery with other forms of angina pectoris: Secondary | ICD-10-CM

## 2021-05-10 DIAGNOSIS — Z7982 Long term (current) use of aspirin: Secondary | ICD-10-CM | POA: Insufficient documentation

## 2021-05-10 DIAGNOSIS — F17210 Nicotine dependence, cigarettes, uncomplicated: Secondary | ICD-10-CM | POA: Insufficient documentation

## 2021-05-10 DIAGNOSIS — J432 Centrilobular emphysema: Secondary | ICD-10-CM | POA: Insufficient documentation

## 2021-05-10 HISTORY — DX: Angina pectoris, unspecified: I20.9

## 2021-05-10 HISTORY — PX: CORONARY STENT INTERVENTION: CATH118234

## 2021-05-10 HISTORY — PX: INTRAVASCULAR PRESSURE WIRE/FFR STUDY: CATH118243

## 2021-05-10 HISTORY — DX: Abnormal findings on diagnostic imaging of heart and coronary circulation: R93.1

## 2021-05-10 HISTORY — PX: LEFT HEART CATH AND CORONARY ANGIOGRAPHY: CATH118249

## 2021-05-10 LAB — POCT ACTIVATED CLOTTING TIME
Activated Clotting Time: 254 seconds
Activated Clotting Time: 300 seconds

## 2021-05-10 SURGERY — LEFT HEART CATH AND CORONARY ANGIOGRAPHY
Anesthesia: LOCAL

## 2021-05-10 MED ORDER — CLOPIDOGREL BISULFATE 300 MG PO TABS
ORAL_TABLET | ORAL | Status: AC
  Filled 2021-05-10: qty 2

## 2021-05-10 MED ORDER — HEPARIN (PORCINE) IN NACL 1000-0.9 UT/500ML-% IV SOLN
INTRAVENOUS | Status: AC
  Filled 2021-05-10: qty 500

## 2021-05-10 MED ORDER — HEPARIN SODIUM (PORCINE) 1000 UNIT/ML IJ SOLN
INTRAMUSCULAR | Status: DC | PRN
  Administered 2021-05-10: 5500 [IU] via INTRAVENOUS
  Administered 2021-05-10: 4500 [IU] via INTRAVENOUS
  Administered 2021-05-10: 3000 [IU] via INTRAVENOUS

## 2021-05-10 MED ORDER — ROSUVASTATIN CALCIUM 20 MG PO TABS: 20.0000 mg | ORAL_TABLET | Freq: Every day | ORAL | 3 refills | Status: DC

## 2021-05-10 MED ORDER — SODIUM CHLORIDE 0.9% FLUSH: 3.0000 mL | Freq: Two times a day (BID) | INTRAVENOUS | Status: DC

## 2021-05-10 MED ORDER — SODIUM CHLORIDE 0.9 % IV SOLN: 250.0000 mL | INTRAVENOUS | Status: DC | PRN

## 2021-05-10 MED ORDER — SODIUM CHLORIDE 0.9 % WEIGHT BASED INFUSION
3.0000 mL/kg/h | INTRAVENOUS | Status: AC
  Administered 2021-05-10: 3 mL/kg/h via INTRAVENOUS

## 2021-05-10 MED ORDER — FENTANYL CITRATE (PF) 100 MCG/2ML IJ SOLN
INTRAMUSCULAR | Status: DC | PRN
  Administered 2021-05-10: 50 ug via INTRAVENOUS

## 2021-05-10 MED ORDER — LABETALOL HCL 5 MG/ML IV SOLN: 10.0000 mg | INTRAVENOUS | Status: DC | PRN

## 2021-05-10 MED ORDER — SODIUM CHLORIDE 0.9% FLUSH: 3.0000 mL | INTRAVENOUS | Status: DC | PRN

## 2021-05-10 MED ORDER — FENTANYL CITRATE (PF) 100 MCG/2ML IJ SOLN
INTRAMUSCULAR | Status: AC
  Filled 2021-05-10: qty 2

## 2021-05-10 MED ORDER — VERAPAMIL HCL 2.5 MG/ML IV SOLN
INTRAVENOUS | Status: DC | PRN
  Administered 2021-05-10: 10 mL via INTRA_ARTERIAL

## 2021-05-10 MED ORDER — HEPARIN SODIUM (PORCINE) 1000 UNIT/ML IJ SOLN
INTRAMUSCULAR | Status: AC
  Filled 2021-05-10: qty 1

## 2021-05-10 MED ORDER — MIDAZOLAM HCL 2 MG/2ML IJ SOLN
INTRAMUSCULAR | Status: DC | PRN
  Administered 2021-05-10: 1 mg via INTRAVENOUS

## 2021-05-10 MED ORDER — VERAPAMIL HCL 2.5 MG/ML IV SOLN
INTRAVENOUS | Status: AC
  Filled 2021-05-10: qty 2

## 2021-05-10 MED ORDER — HYDRALAZINE HCL 20 MG/ML IJ SOLN: 10.0000 mg | INTRAMUSCULAR | Status: DC | PRN

## 2021-05-10 MED ORDER — ADENOSINE 12 MG/4ML IV SOLN
INTRAVENOUS | Status: AC
  Filled 2021-05-10: qty 16

## 2021-05-10 MED ORDER — SODIUM CHLORIDE 0.9 % WEIGHT BASED INFUSION: 1.0000 mL/kg/h | INTRAVENOUS | Status: DC

## 2021-05-10 MED ORDER — ROSUVASTATIN CALCIUM 10 MG PO TABS: 20.0000 mg | ORAL_TABLET | Freq: Every day | ORAL | 0 refills | Status: DC

## 2021-05-10 MED ORDER — ASPIRIN 81 MG PO CHEW: 81.0000 mg | CHEWABLE_TABLET | ORAL | Status: DC

## 2021-05-10 MED ORDER — ADENOSINE (DIAGNOSTIC) 140MCG/KG/MIN
INTRAVENOUS | Status: DC | PRN
  Administered 2021-05-10: 140 ug/kg/min via INTRAVENOUS

## 2021-05-10 MED ORDER — LIDOCAINE HCL (PF) 1 % IJ SOLN
INTRAMUSCULAR | Status: AC
  Filled 2021-05-10: qty 30

## 2021-05-10 MED ORDER — NITROGLYCERIN 1 MG/10 ML FOR IR/CATH LAB
INTRA_ARTERIAL | Status: AC
  Filled 2021-05-10: qty 10

## 2021-05-10 MED ORDER — ACETAMINOPHEN 325 MG PO TABS
650.0000 mg | ORAL_TABLET | ORAL | Status: DC | PRN
Start: 2021-05-10 — End: 2021-05-10

## 2021-05-10 MED ORDER — NITROGLYCERIN 1 MG/10 ML FOR IR/CATH LAB
INTRA_ARTERIAL | Status: DC | PRN
  Administered 2021-05-10: 200 ug

## 2021-05-10 MED ORDER — CLOPIDOGREL BISULFATE 300 MG PO TABS
ORAL_TABLET | ORAL | Status: DC | PRN
  Administered 2021-05-10: 600 mg via ORAL

## 2021-05-10 MED ORDER — CLOPIDOGREL BISULFATE 75 MG PO TABS: 75.0000 mg | ORAL_TABLET | Freq: Every day | ORAL | Status: DC

## 2021-05-10 MED ORDER — CLOPIDOGREL BISULFATE 75 MG PO TABS
75.0000 mg | ORAL_TABLET | Freq: Every day | ORAL | 3 refills | Status: DC
  Filled 2021-05-10: qty 90, 90d supply, fill #0

## 2021-05-10 MED ORDER — CLOPIDOGREL BISULFATE 75 MG PO TABS: 75.0000 mg | ORAL_TABLET | Freq: Every day | ORAL | 0 refills | Status: DC

## 2021-05-10 MED ORDER — ONDANSETRON HCL 4 MG/2ML IJ SOLN: 4.0000 mg | Freq: Four times a day (QID) | INTRAMUSCULAR | Status: DC | PRN

## 2021-05-10 MED ORDER — LIDOCAINE HCL (PF) 1 % IJ SOLN
INTRAMUSCULAR | Status: DC | PRN
  Administered 2021-05-10: 2 mL

## 2021-05-10 MED ORDER — MIDAZOLAM HCL 2 MG/2ML IJ SOLN
INTRAMUSCULAR | Status: AC
  Filled 2021-05-10: qty 2

## 2021-05-10 MED ORDER — HEPARIN (PORCINE) IN NACL 1000-0.9 UT/500ML-% IV SOLN
INTRAVENOUS | Status: DC | PRN
  Administered 2021-05-10 (×2): 500 mL

## 2021-05-10 MED ORDER — IOHEXOL 350 MG/ML SOLN
INTRAVENOUS | Status: DC | PRN
  Administered 2021-05-10: 165 mL

## 2021-05-10 MED ORDER — ACETAMINOPHEN 325 MG PO TABS: 650.0000 mg | ORAL_TABLET | ORAL | Status: DC | PRN

## 2021-05-10 SURGICAL SUPPLY — 21 items
BALLN SAPPHIRE 2.25X15 (BALLOONS) ×2
BALLOON SAPPHIRE 2.25X15 (BALLOONS) IMPLANT
CATH OPTITORQUE TIG 4.0 5F (CATHETERS) ×1 IMPLANT
CATH VISTA GUIDE 6FR JR4 (CATHETERS) ×1 IMPLANT
CATH VISTA GUIDE 6FR XBLAD3.5 (CATHETERS) ×1 IMPLANT
DEVICE RAD COMP TR BAND LRG (VASCULAR PRODUCTS) ×1 IMPLANT
ELECT DEFIB PAD ADLT CADENCE (PAD) ×1 IMPLANT
GLIDESHEATH SLEND SS 6F .021 (SHEATH) ×1 IMPLANT
GUIDEWIRE INQWIRE 1.5J.035X260 (WIRE) IMPLANT
GUIDEWIRE PRESSURE X 175 (WIRE) ×1 IMPLANT
INQWIRE 1.5J .035X260CM (WIRE) ×2
KIT ENCORE 26 ADVANTAGE (KITS) ×1 IMPLANT
KIT ESSENTIALS PG (KITS) ×1 IMPLANT
KIT HEART LEFT (KITS) ×2 IMPLANT
PACK CARDIAC CATHETERIZATION (CUSTOM PROCEDURE TRAY) ×2 IMPLANT
SHEATH PROBE COVER 6X72 (BAG) ×1 IMPLANT
STENT SYNERGY XD 2.50X48 (Permanent Stent) IMPLANT
SYNERGY XD 2.50X48 (Permanent Stent) ×2 IMPLANT
TRANSDUCER W/STOPCOCK (MISCELLANEOUS) ×2 IMPLANT
TUBING CIL FLEX 10 FLL-RA (TUBING) ×2 IMPLANT
WIRE ASAHI PROWATER 180CM (WIRE) ×1 IMPLANT

## 2021-05-10 NOTE — Discharge Instructions (Signed)

## 2021-05-10 NOTE — Discharge Summary (Signed)
Discharge Summary for Same Day PCI   Patient ID: Leroy Hart MRN: [id number]; DOB: 08-27-1960  Admit date: 05/10/2021 Discharge date: 05/10/2021  Primary Care Provider: Eber Jones, NP  Primary Cardiologist: Norman Herrlich, MD  Primary Electrophysiologist:  None   Discharge Diagnoses    Principal Problem:   Abnormal cardiac CT angiography Active Problems:   Angina, class III [facility])   Coronary artery disease of native artery of native heart with stable angina pectoris (HCC)   HLD   HTN   Diagnostic Studies/Procedures    Cardiac Catheterization 05/10/2021:  CORONARY STENT INTERVENTION  INTRAVASCULAR PRESSURE WIRE/FFR STUDY  LEFT HEART CATH AND CORONARY ANGIOGRAPHY   Conclusion      Culprit Lesion Segment: Prox RCA lesion is 65% stenosed.  Prox RCA to Mid RCA lesion is 80% stenosed.   A drug-eluting stent was successfully placed using a SYNERGY XD 2.50X48. ->  Postdilated 2.75 mm   Post intervention, there is a 0% residual stenosis throughout the stented segment.Marland Kitchen   ------------------------------------------------------------   Prox LAD to Mid LAD lesion is 20% stenosed with 40% stenosed side branch in 1st Diag.   Mid LAD lesion is 60% stenosed with 35% stenosed side branch in 2nd Diag. - (RFR 0.91, FFR 0.82 --> Borderline, consistent with CT FFR)   Dist Cx lesion is 40% stenosed with 40% stenosed side branch in 3rd Mrg.   ------------------------------------------------------------   The left ventricular systolic function is normal.  The left ventricular ejection fraction is 55-65% by visual estimate.  LV end diastolic pressure is normal   There is no aortic valve stenosis.   SUMMARY Two-vessel CAD Severe tandem lesions in the proximal to mid RCA (65% and 80%) lesions with documented positivity by CT FFR Successful DES PCI covering both lesions (using a single Synergy DES 2.5 mm x 48 mm postdilated to 2.75 mm.) Moderate mid LAD stenosis (CT FFR estimated 0.82)  -> RFR 0.91-0.92, FFR 0.82 showing consistent borderline/nonischemic findings.  (Treat medically) Minimal disease in the AV groove circumflex Relatively normal LVEF with normal EDP.     RECOMMENDATIONS Discharge to home after PACU -plan same-day discharge-6 hours DAPT x6 months uninterrupted, then continue Plavix monotherapy for at least 2 total years given the extent of RCA stent and LAD disease Increase rosuvastatin 40 mg daily and continue other home medications ->  currently on diltiazem as opposed to beta-blocker, will defer to primary cardiologist    Diagnostic Dominance: Right Intervention    History of Present Illness     Leroy Hart is a 60 y.o. male with hypertension, emphysema with ongoing tobacco smoking and recent abnormal coronary CT presents for cardiac catheterization.  Recently established care with Dr. Dulce Sellar in July 2022 for exertional shortness of breath with neck discomfort.  He had evidence of coronary artery calcification of CT angio of the chest.  His symptoms were concerning for typical angina.  Follow up coronary CT: FINDINGS: FFRct analysis was performed on the original cardiac CT angiogram dataset. Diagrammatic representation of the FFRct analysis is provided in a separate PDF document in PACS. This dictation was created using the PDF document and an interactive 3D model of the results. 3D model is not available in the EMR/PACS. Normal FFR range is >0.80.   1. LM: 0.99   2. LAD: Prox 0.94, mid (distal from the lesion) 0.82, distal 0.72 3. LCX: Prox 0.94, mid 0.85, distal 0.79 4. RCA: Prox 0.99, mid (distal from the lesion) 0.79, distal 0.76   IMPRESSION:  Lesion seen in mid LAD is hemodynamically not significant.   Lesion noted in mid portion of RCA has borderline hemodynamic characteristic.   If pt is symptomatic consider cardiac catheterization.   Cardiac catheterization was arranged for further evaluation.  Hospital Course      The patient underwent cardiac cath as noted above   Two-vessel CAD Severe tandem lesions in the proximal to mid RCA (65% and 80%) lesions with documented positivity by CT FFR Successful DES PCI covering both lesions (using a single Synergy DES 2.5 mm x 48 mm postdilated to 2.75 mm.) Moderate mid LAD stenosis (CT FFR estimated 0.82) -> RFR 0.91-0.92, FFR 0.82 showing consistent borderline/nonischemic findings.  (Treat medically) Minimal disease in the AV groove circumflex   Plan for DAPT with ASA/Plavix for at least 37-months , then Plavix as monotherapy for at least 2 years given extent of RCA stent and LAD disease. The patient was seen by cardiac rehab while in short stay. There were no observed complications post cath. Radial cath site was re-evaluated prior to discharge and found to be stable without any complications. Instructions/precautions regarding cath site care were given prior to discharge.  Leroy Hart was seen by Dr. Herbie Baltimore and determined stable for discharge home. Follow up with our office has been arranged. Medications are listed below. Pertinent changes include addition of Plavix and increase Crestor.   Recommended changing diltiazem to beta-blocker in outpatient setting per primary cardiologist.  _____________  Cath/PCI Registry Performance & Quality Measures: Aspirin prescribed? - Yes ADP Receptor Inhibitor (Plavix/Clopidogrel, Brilinta/Ticagrelor or Effient/Prasugrel) prescribed (includes medically managed patients)? - Yes High Intensity Statin (Lipitor 40-80mg  or Crestor 20-40mg ) prescribed? - Yes For EF <40%, was ACEI/ARB prescribed? - Yes For EF <40%, Aldosterone Antagonist (Spironolactone or Eplerenone) prescribed? - Not Applicable (EF >/= 40%) Cardiac Rehab Phase II ordered (Included Medically managed Patients)? - Yes  _____________   Discharge Vitals Blood pressure (!) 160/84, pulse 73, temperature 97.8 F (36.6 C), temperature source Oral, resp.  rate (!) 9, height 5\' 9"  (1.753 m), weight 111.1 kg, SpO2 98 %.  Filed Weights   05/10/21 0540  Weight: 111.1 kg    Last Labs & Radiologic Studies   _____________  CARDIAC CATHETERIZATION  Result Date: 05/10/2021   Culprit Lesion Segment: Prox RCA lesion is 65% stenosed.  Prox RCA to Mid RCA lesion is 80% stenosed.   A drug-eluting stent was successfully placed using a SYNERGY XD 2.50X48. ->  Postdilated 2.75 mm   Post intervention, there is a 0% residual stenosis throughout the stented segment.05/12/2021   ------------------------------------------------------------   Prox LAD to Mid LAD lesion is 20% stenosed with 40% stenosed side branch in 1st Diag.   Mid LAD lesion is 60% stenosed with 35% stenosed side branch in 2nd Diag. - (RFR 0.91, FFR 0.82 --> Borderline, consistent with CT FFR)   Dist Cx lesion is 40% stenosed with 40% stenosed side branch in 3rd Mrg.   ------------------------------------------------------------   The left ventricular systolic function is normal.  The left ventricular ejection fraction is 55-65% by visual estimate.  LV end diastolic pressure is normal   There is no aortic valve stenosis. SUMMARY Two-vessel CAD Severe tandem lesions in the proximal to mid RCA (65% and 80%) lesions with documented positivity by CT FFR Successful DES PCI covering both lesions (using a single Synergy DES 2.5 mm x 48 mm postdilated to 2.75 mm.) Moderate mid LAD stenosis (CT FFR estimated 0.82) -> RFR 0.91-0.92, FFR 0.82 showing consistent borderline/nonischemic findings.  (  Treat medically) Minimal disease in the AV groove circumflex Relatively normal LVEF with normal EDP. RECOMMENDATIONS Discharge to home after PACU -plan same-day discharge-6 hours DAPT x6 months uninterrupted, then continue Plavix monotherapy for at least 2 total years given the extent of RCA stent and LAD disease Increase rosuvastatin 40 mg daily and continue other home medications -> currently on diltiazem as opposed to beta-blocker,  will defer to primary cardiologist Bryan Lemma, MD  CT CORONARY [facility] W/CTA COR W/SCORE [facility] W/CM &/OR WO/CM  Addendum Date: 04/30/2021   ADDENDUM REPORT: 04/30/2021 18:23 CLINICAL DATA:  Atypical chestpain EXAM: Cardiac/Coronary  CTA TECHNIQUE: The patient was scanned on a [employer]. FINDINGS: A 120 kV prospective scan was triggered in the descending thoracic aorta at 111 HU's. Axial non-contrast 3 mm slices were carried out through the heart. The data set was analyzed on a dedicated work station and scored using the Agatson method. Gantry rotation speed was 250 msecs and collimation was .6 mm. No beta blockade and 0.8 mg of sl NTG was given. The 3D data set was reconstructed in 5% intervals of the 67-82 % of the R-R cycle. Diastolic phases were analyzed on a dedicated work station using MPR, MIP and VRT modes. The patient received 80 cc of contrast. Aorta: Mildly enlarged ascending aorta - 38 mm. No calcifications. No dissection. Aortic Valve:  Trileaflet.  No calcifications. Coronary Arteries:  Normal coronary origin.  Right dominance. RCA is a large dominant artery that gives rise to PDA and PLA. There is soft moderate (50-70% stenosis), plaque in the mid portion of RCA Left main is a moderate size artery that gives rise to LAD, LCX arteries as well as to small intermediate branch. LAD is a large vessel. There is a calcified plaque that extends into left main coronary artery. This plaque create moderate stenosis (50-70%) of the proximal portion of the LAD. In the mid portion of LAD small minimal (0-24% stenosis) non obstructive calcified plaque is noted. LAD gives rise to moderate size D1. D1 has mild calcified, non obstructive plaques. Small D2 is noted and is free of disease. LCX is a non-dominant artery that gives rise to moderate size OM1 branch as well as moderate size OM2. There are soft, mild (25-49%) non obstructive plaques noted in mid portion of CX Other findings: Normal pulmonary  vein drainage into the left atrium. Normal left atrial appendage without a thrombus. Normal size of the pulmonary artery. IMPRESSION: 1. Coronary calcium score: Not performed. 2. Normal coronary origin with right dominance. 3. CAD-RADS 3. Moderate stenosis. Consider symptom-guided anti-ischemic pharmacotherapy as well as risk factor modification per guideline directed care. Additional analysis with CT FFR will be submitted. Electronically Signed   By: Gypsy Balsam M.D.   On: 04/30/2021 18:23   Result Date: 04/30/2021 EXAM: OVER-READ INTERPRETATION  CT CHEST The following report is an over-read performed by radiologist Dr. Jeronimo Greaves of [facility] on 04/30/2021. This over-read does not include interpretation of cardiac or coronary anatomy or pathology. The coronary CTA interpretation by the cardiologist is attached. COMPARISON:  01/21/2021 CTA chest FINDINGS: Vascular: Aortic atherosclerosis. No central pulmonary embolism, on this non-dedicated study. Mediastinum/Nodes: No imaged thoracic adenopathy. Lungs/Pleura: No pleural fluid. No lobar consolidation. Bilateral subpleural predominant pulmonary nodules including up to 6 mm are similar back to 01/19/2021. Upper Abdomen: Normal imaged portions of the liver, spleen, stomach. Musculoskeletal: No acute osseous abnormality. IMPRESSION: 1.  No acute findings in the imaged extracardiac chest. 2.  Aortic Atherosclerosis (  ICD10-I70.0). 3. Bilateral pulmonary nodules are subpleural predominant and similar back for 3 months. Per Fleischner criteria, follow-up chest CT at 15-21 months is considered optional for low risk patients, but recommended for high risk patients. This recommendation follows the consensus statement: Guidelines for Management of Incidental Pulmonary Nodules Detected on CT Images:From the Fleischner Society 2017; published online before print (10.1148/radiol.5427062376). Electronically Signed: By: Jeronimo Greaves M.D. On: 04/30/2021 13:25    CT CORONARY FRACTIONAL FLOW RESERVE DATA PREP  Result Date: 05/03/2021 EXAM: FFRCT ANALYSIS FINDINGS: FFRct analysis was performed on the original cardiac CT angiogram dataset. Diagrammatic representation of the FFRct analysis is provided in a separate PDF document in PACS. This dictation was created using the PDF document and an interactive 3D model of the results. 3D model is not available in the EMR/PACS. Normal FFR range is >0.80. 1. LM: 0.99 2. LAD: Prox 0.94, mid (distal from the lesion) 0.82, distal 0.72 3. LCX: Prox 0.94, mid 0.85, distal 0.79 4. RCA: Prox 0.99, mid (distal from the lesion) 0.79, distal 0.76 IMPRESSION: Lesion seen in mid LAD is hemodynamically not significant. Lesion noted in mid portion of RCA has borderline hemodynamic characteristic. If pt is symptomatic consider cardiac catheterization. Electronically Signed   By: Gypsy Balsam M.D.   On: 05/03/2021 18:01    Disposition   Pt is being discharged home today in good condition.  Follow-up Plans & Appointments     Follow-up Information     Baldo Daub, MD .   Specialties: Cardiology, Radiology Contact information: [address] [place] [address] [phone]                Discharge Instructions     Amb Referral to Cardiac Rehabilitation   Complete by: As directed    Referring to [place] CRP2   Diagnosis: Coronary Stents   After initial evaluation and assessments completed: Virtual Based Care may be provided alone or in conjunction with Phase 2 Cardiac Rehab based on patient barriers.: Yes   Diet - low sodium heart healthy   Complete by: As directed    Discharge instructions   Complete by: As directed    No driving for 48 hours. No lifting over 5 lbs for 1 week. No sexual activity for 1 week. You may return to work on 05/17/21. Keep procedure site clean & dry. If you notice increased pain, swelling, bleeding or pus, call/return!  You may shower, but no soaking baths/hot tubs/pools for 1  week.   Increase activity slowly   Complete by: As directed         Discharge Medications   Allergies as of 05/10/2021   No Known Allergies      Medication List     TAKE these medications    aspirin EC 81 MG tablet Take 1 tablet (81 mg total) by mouth daily. Swallow whole.   clopidogrel 75 MG tablet Commonly known as: Plavix Take 1 tablet (75 mg total) by mouth daily.   diltiazem 240 MG 24 hr capsule Commonly known as: CARDIZEM CD Take 1 capsule (240 mg total) by mouth daily.   losartan 50 MG tablet Commonly known as: COZAAR Take 50 mg by mouth daily.   nitroGLYCERIN 0.4 MG SL tablet Commonly known as: NITROSTAT Place 1 tablet (0.4 mg total) under the tongue every 5 (five) minutes as needed for chest pain.   rosuvastatin 20 MG tablet Commonly known as: Crestor Take 1 tablet (20 mg total) by mouth daily. Start taking on: June 09, 2021 What changed:  medication strength how much to take These instructions start on June 09, 2021. If you are unsure what to do until then, ask your doctor or other care provider.   Spiriva Respimat 2.5 MCG/ACT Aers Generic drug: Tiotropium Bromide Monohydrate Inhale 2.5 mcg into the lungs daily.   Vitamin D3 25 MCG tablet Commonly known as: Vitamin D Take 1,000 Units by mouth daily.          Allergies No Known Allergies  Outstanding Labs/Studies   Lipid panel and LFTS in 6-8 weeks   Duration of Discharge Encounter   Greater than 30 minutes including physician time.  Lorelei Pont, PA 05/10/2021, 2:41 PM

## 2021-05-10 NOTE — Progress Notes (Signed)
Patient and wife was given discharge instructions. Both verbalized understanding. 

## 2021-05-10 NOTE — Interval H&P Note (Signed)
History and Physical Interval Note:  05/10/2021 7:47 AM  Leroy Hart  has presented today for surgery, with the diagnosis of cad - chest pain; abnormal cardiac ct angiography.  The various methods of treatment have been discussed with the patient and family. After consideration of risks, benefits and other options for treatment, the patient has consented to  Procedure(s): LEFT HEART CATH AND CORONARY ANGIOGRAPHY (N/A)  PERCUTANEOUS CORONARY INTERVENTION   as a surgical intervention.  The patient's history has been reviewed, patient examined, no change in status, stable for surgery.  I have reviewed the patient's chart and labs.  Questions were answered to the patient's satisfaction.    Cath Lab Visit (complete for each Cath Lab visit)  Clinical Evaluation Leading to the Procedure:   ACS: No.  Non-ACS:    Anginal Classification: CCS II  Anti-ischemic medical therapy: Minimal Therapy (1 class of medications)  Non-Invasive Test Results: Intermediate-risk stress test findings: cardiac mortality 1-3%/year  Prior CABG: No previous CABG    Bryan Lemma

## 2021-05-10 NOTE — Progress Notes (Signed)
[id number] Education completed with pt who voiced understanding. Stressed importance of plavix with stent. Reviewed NTG use, walking for ex, heart healthy food choices and smoking cessation.. pt is hopeful to quit cold [place]. Gave smoking cessation handout and encouraged to call 1800quitnow. Discussed CRP 2 and referral letter to be sent to [facility]. Luetta Nutting RN BSN 05/10/2021 1:15 PM

## 2021-05-10 NOTE — Telephone Encounter (Signed)
-----   Message from Kardie Tobb, DO sent at 05/09/2021  7:41 PM EDT ----- This is Dr. Munley's patient: LDL 71 ideally should be less than 55, HDL is 34 and triglyceride 340.  I would recommend increasing your Crestor to 10 mg daily.  It would be beneficial to add Vascepa 500 mg twice daily to your regimen as well.  If you like to wait and discuss this with Dr. Munley that will be fine as well. Other labs stable 

## 2021-05-10 NOTE — Brief Op Note (Addendum)
BRIEF CARDIAC CATHETERIZATION / PCI REPORT  05/10/2021 9:40 AM  PCP:  Barnetta Chapel, NP    Cardiologist:  Shirlee More, MD    SURGEON:  Surgeon(s) and Role:    * Leonie Man, MD - Primary  PATIENT:  Leroy Hart  61 y.o. male  Leroy Hart is a 60 y.o. male with a hx of coronary artery calcification on CT scan ongoing cigarette smoking family history of premature CAD last seen 03/19/2021 with symptoms of exertional shortness of breath and neck discomfort anginal in nature out of proportion to emphysema seen on CT scan.  He does note exertional chest pain at rest or walking roughly 250 yards.  Likely consistent with class II angina but somewhat worsening.  He is therefore referred for invasive valuation Cardiac Catheterization possible Percutaneous Coronary intervention.  PRE-OPERATIVE DIAGNOSIS: Abnormal cardiac CT angiogram, class II-III angina  POST-OPERATIVE DIAGNOSIS:   Two-vessel CAD Severe tandem lesions in the proximal to mid RCA (65% and 80%) lesions with documented positivity by CT FFR Successful DES PCI covering both lesions (using a single Synergy DES 2.5 mm x 48 mm postdilated to 2.75 mm.) Moderate mid LAD stenosis (CT FFR estimated 0.82) -> RFR 0.91-0.92, FFR 0.82 showing consistent borderline/nonischemic findings.  (Treat medically) Minimal disease in the AV groove circumflex Relatively normal LVEF with normal EDP.  PROCEDURE:  Procedure(s): LEFT HEART CATH AND CORONARY ANGIOGRAPHY (N/A) INTRAVASCULAR PRESSURE WIRE/FFR STUDY (N/A) CORONARY STENT INTERVENTION (N/A)   Time Out: Verified patient identification, verified procedure, site/side was marked, verified correct patient position, special equipment/implants available, medications/allergies/relevent history reviewed, required imaging and test results available. Performed.  Access:  RIGHT Radial Artery: 6 Fr sheath -- Seldinger technique using Micropuncture Kit -- Direct ultrasound  guidance used.  Permanent image obtained and placed on chart. -- 10 mL radial cocktail IA; 5500 Units IV Heparin  Left Heart Catheterization: 5& 6Fr Catheters advanced or exchanged over a J-wire under direct fluoroscopic guidance into the ascending aorta; TIG 4.0 catheter advanced first.  * LV Hemodynamics (LV Gram): TIG 4.0 Catheter * Left Coronary Artery Cineangiography: TIG 4.0 Catheter  * Right Coronary Artery Cineangiography: TIG 4.0 Catheter   Review of initial angiography revealed:  Moderate mid LAD stenosis at second diagonal branch of borderline significance (60%) Tandem severe 65% and 80% lesions in the proximal and mid RCA as documented on CT angiogram with positive CT FFR  Preparations are made for RFR/FFR measurement of the LAD and PCI of indicated followed by PCI of the RCA  RFR/FFR (Resting Flow Reserve/Fractional Flow Reserve) measurement in the LAD: 6 [place] XB LAD 3.5 guide catheter ; pressure X wire performed -> RFR 0.91-0.92; adenosine infused for 2 minutes-FFR 0.2 (nonspecific) Wire removed, then guide catheter removed over wire without complication  PCI of the RCA performed: See FINDINGS 6 [place] JR4 guide catheter, Prowater wire Initial angioplasty with 2.25 mm x 15 mm standard balloon -> 8 ATM X 20 sec Stent: Synergy DES 2.5 mm x 48 mm -> deployed at 14 ATM x30 sec, postdilated with stent balloon 18 ATM x20 seconds (final diameter 2.75 mm)  Upon completion of Angiogaphy, the catheter was removed completely out of the body over a wire, without complication.  Radial sheath removed in the Cardiac Catheterization lab with TR Band placed for hemostasis.  TR Band: 0915  Hours; 11 mL air  MEDICATIONS SQ Lidocaine 2 mL Radial Cocktail: 3 mg Verapmil in 10 mL NS Heparin: Total 13,000 units  p.o.Plavix 600 mg  IC NTG 200 mcg x 1  EBL:  <50 Ml   COUNTS:  YES  DICTATION: .Note written in EPIC  PLAN OF CARE: Discharge to home after PACU -plan same-day discharge-6  hours DAPT x6 months uninterrupted, then continue Plavix monotherapy for at least 2 total years given the extent of RCA stent and LAD disease Increase rosuvastatin 40 mg daily and continue other home medications ->  currently on diltiazem as opposed to beta-blocker, will defer to primary cardiologist  PATIENT DISPOSITION:  PACU - hemodynamically stable.   Delay start of Pharmacological VTE agent (>24hrs) due to surgical blood loss or risk of bleeding: not applicable  He will follow-up with Dr. Daisy Floro, MD

## 2021-05-10 NOTE — Telephone Encounter (Signed)
Left message on patients voicemail to please return our call.   

## 2021-05-11 ENCOUNTER — Telehealth: Payer: Self-pay

## 2021-05-11 ENCOUNTER — Telehealth (HOSPITAL_COMMUNITY): Payer: Self-pay

## 2021-05-11 MED ORDER — VASCEPA 0.5 G PO CAPS: 4.0000 | ORAL_CAPSULE | Freq: Two times a day (BID) | ORAL | 3 refills | Status: DC

## 2021-05-11 NOTE — Telephone Encounter (Signed)
Spoke with patient regarding results and recommendation.  Patient verbalizes understanding and is agreeable to plan of care. Advised patient to call back with any issues or concerns.  

## 2021-05-11 NOTE — Telephone Encounter (Signed)
-----   Message from Thomasene Ripple, DO sent at 05/09/2021  7:41 PM EDT ----- This is Dr. Hulen Shouts patient: LDL 71 ideally should be less than 55, HDL is 34 and triglyceride 340.  I would recommend increasing your Crestor to 10 mg daily.  It would be beneficial to add Vascepa 500 mg twice daily to your regimen as well.  If you like to wait and discuss this with Dr. Dulce Sellar that will be fine as well. Other labs stable

## 2021-05-11 NOTE — Telephone Encounter (Signed)
Per phase I cardiac rehab, fax cardiac rehab referral to [facility]. °

## 2021-05-12 ENCOUNTER — Telehealth: Payer: Self-pay

## 2021-05-12 NOTE — Telephone Encounter (Signed)
Prior Auth for Leroy Hart is approved from 05/12/21-05/11/22.Confirmation [id number]

## 2021-05-27 ENCOUNTER — Telehealth: Payer: Self-pay | Admitting: Cardiology

## 2021-05-27 MED ORDER — ROSUVASTATIN CALCIUM 20 MG PO TABS: 20.0000 mg | ORAL_TABLET | Freq: Every day | ORAL | 3 refills | Status: DC

## 2021-05-27 NOTE — Telephone Encounter (Signed)
Refill sent in per request.  

## 2021-05-27 NOTE — Telephone Encounter (Signed)
A copy of the referral was sent at this time. This order was placed by Dr. Herbie Baltimore.

## 2021-05-27 NOTE — Telephone Encounter (Signed)
Pt c/o medication issue:  1. Name of Medication:  rosuvastatin (CRESTOR) 20 MG tablet  2. How are you currently taking this medication (dosage and times per day)? 1 tablet a day   3. Are you having a reaction (difficulty breathing--STAT)? No   4. What is your medication issue? Leroy Hart is out of this medication, but the pharmacy still has the 10 MG tablets on file. He is needing a new 20 MG prescription written and sent to the pharmacy.

## 2021-05-27 NOTE — Telephone Encounter (Signed)
New Message:     Pt says he needs a copy of the order for Cardiac Rehab. He says his job needs it for [employer]. Please fax to [phone] Att :Sophia

## 2021-05-28 ENCOUNTER — Other Ambulatory Visit (HOSPITAL_COMMUNITY): Payer: Self-pay

## 2021-05-28 ENCOUNTER — Telehealth (HOSPITAL_COMMUNITY): Payer: Self-pay

## 2021-05-28 NOTE — Telephone Encounter (Signed)
Pharmacy Transitions of Care Follow-up Telephone Call  Date of discharge: 05/10/21  Discharge Diagnosis: angioplasty  How have you been since you were released from the hospital?  Patient has been feeling much better since discharge. No questions about meds at this time.  Medication changes made at discharge:     START taking: clopidogrel (Plavix)  STOP taking: rosuvastatin 10 MG tablet (CRESTOR)   Medication changes verified by the patient? Yes    Medication Accessibility:  Home Pharmacy:  [address] [place]  Was the patient provided with refills on discharged medications? Yes   Have all prescriptions been transferred from [facility] to home pharmacy?  Yes  Is the patient able to afford medications? Has insurance    Medication Review:  CLOPIDOGREL (PLAVIX) Clopidogrel 75 mg once daily.  - Educated patient on expected duration of therapy of ASA with clopidogrel.  - Advised patient of medications to avoid (NSAIDs, ASA)  - Educated that Tylenol (acetaminophen) will be the preferred analgesic to prevent risk of bleeding  - Emphasized importance of monitoring for signs and symptoms of bleeding (abnormal bruising, prolonged bleeding, nose bleeds, bleeding from gums, discolored urine, black tarry stools)  - Advised patient to alert all providers of anticoagulation therapy prior to starting a new medication or having a procedure   Follow-up Appointments:  PCP Hospital f/u appt confirmed? None currently scheduled   Specialist Hospital f/u appt confirmed?  Scheduled to see Dr. Dulce Sellar on 06/08/21 @ Cardiology.   If their condition worsens, is the pt aware to call PCP or go to the Emergency Dept.? yes  Final Patient Assessment: Patient has follow up scheduled and refills at home pharmacy

## 2021-05-31 DIAGNOSIS — E559 Vitamin D deficiency, unspecified: Secondary | ICD-10-CM | POA: Insufficient documentation

## 2021-05-31 DIAGNOSIS — I10 Essential (primary) hypertension: Secondary | ICD-10-CM | POA: Insufficient documentation

## 2021-05-31 DIAGNOSIS — R5382 Chronic fatigue, unspecified: Secondary | ICD-10-CM | POA: Insufficient documentation

## 2021-05-31 DIAGNOSIS — E291 Testicular hypofunction: Secondary | ICD-10-CM | POA: Insufficient documentation

## 2021-05-31 NOTE — Telephone Encounter (Signed)
Spoke to the pharmacy just now and they tell me that they did not receive the prescription that I sent in on Thursday. I gave them a verbal order for this and they said they would get it ready for the patient.   I called the patient to let him know this as well and he verbalizes understanding.    Encouraged patient to call back with any questions or concerns.

## 2021-05-31 NOTE — Telephone Encounter (Signed)
Pt called in and stated that the rosuvastatin (CRESTOR) 20 MG tablet  Was not called in .  I verified with the pharmacy that they did not rec  this med     Best number [phone]

## 2021-06-07 NOTE — Progress Notes (Signed)
Cardiology Office Note:    Date:  06/08/2021   ID:  Leroy Hart, DOB [birth year], MRN [id number]  PCP:  Eber Jones, NP  Cardiologist:  Norman Herrlich, MD    Referring MD: Eber Jones, NP    ASSESSMENT:    1. Coronary artery disease of native artery of native heart with stable angina pectoris (HCC)   2. Primary hypertension   3. Mixed hyperlipidemia   4. Centrilobular emphysema (HCC)    PLAN:    In order of problems listed above:  Markedly improved having no anginal or anginal equivalent exertional shortness of breath after PCI of the right coronary artery continue his dual antiplatelet therapy has plan for at least 2 years rate limiting calcium channel blocker and lipid-lowering with statin and icosapent ethyl.  Referred to cardiac rehabilitation Stable we will trend his blood pressures in cardiac rehab normal is hesitant to take early morning measurement and change treatment Stable continue current lipid-lowering combination Stable in retrospect his exertional dyspnea is cardiac not pulmonary   Next appointment: 4 months   Medication Adjustments/Labs and Tests Ordered: Current medicines are reviewed at length with the patient today.  Concerns regarding medicines are outlined above.  No orders of the defined types were placed in this encounter.  No orders of the defined types were placed in this encounter.   No chief complaint on file.   History of Present Illness:    Leroy Hart is a 60 y.o. male with a hx of CAD on cardiac CTA hypertension hyperlipidemia emphysema last seen 05/06/2021.  He was referred to coronary angiography and had PCI and stent of the proximal and mid right coronary artery he had moderate mid LAD disease with borderline FFR without intervention.  Compliance with diet, lifestyle and medications: yes  He is remarkably improved in a short period of time, his shortness of breath is resolved he has had no chest pain  palpitation or syncope. He is highly motivated will be starting cardiac rehabilitation He tolerates his statin without muscle pain or weakness Is on dual antiplatelet therapy without GI side effects or bleeding clinically.  05/10/2021: Conclusion   Culprit Lesion Segment: Prox RCA lesion is 65% stenosed.  Prox RCA to Mid RCA lesion is 80% stenosed.   A drug-eluting stent was successfully placed using a SYNERGY XD 2.50X48. ->  Postdilated 2.75 mm   Post intervention, there is a 0% residual stenosis throughout the stented segment.Marland Kitchen   ------------------------------------------------------------   Prox LAD to Mid LAD lesion is 20% stenosed with 40% stenosed side branch in 1st Diag.   Mid LAD lesion is 60% stenosed with 35% stenosed side branch in 2nd Diag. - (RFR 0.91, FFR 0.82 --> Borderline, consistent with CT FFR)   Dist Cx lesion is 40% stenosed with 40% stenosed side branch in 3rd Mrg.   ------------------------------------------------------------   The left ventricular systolic function is normal.  The left ventricular ejection fraction is 55-65% by visual estimate.  LV end diastolic pressure is normal   There is no aortic valve stenosis.   SUMMARY Two-vessel CAD Severe tandem lesions in the proximal to mid RCA (65% and 80%) lesions with documented positivity by CT FFR Successful DES PCI covering both lesions (using a single Synergy DES 2.5 mm x 48 mm postdilated to 2.75 mm.) Moderate mid LAD stenosis (CT FFR estimated 0.82) -> RFR 0.91-0.92, FFR 0.82 showing consistent borderline/nonischemic findings.  (Treat medically) Minimal disease in the AV groove circumflex Relatively normal LVEF with  normal EDP. Coronary Diagrams  Diagnostic Dominance: Right Intervention   Past Medical History:  Diagnosis Date   Chronic fatigue    Closed fracture of distal end of left fibula with routine healing 02/03/2016   Essential hypertension    Testicular hypofunction    Vitamin D deficiency      Past Surgical History:  Procedure Laterality Date   CORONARY STENT INTERVENTION N/A 05/10/2021   Procedure: CORONARY STENT INTERVENTION;  Surgeon: Marykay Lex, MD;  Location: [facility] INVASIVE CV LAB;  Service: Cardiovascular;  Laterality: N/A;   DERMATOLOGY     INTRAVASCULAR PRESSURE WIRE/FFR STUDY N/A 05/10/2021   Procedure: INTRAVASCULAR PRESSURE WIRE/FFR STUDY;  Surgeon: Marykay Lex, MD;  Location: [facility] INVASIVE CV LAB;  Service: Cardiovascular;  Laterality: N/A;   KNEE SURGERY Right    LEFT HEART CATH AND CORONARY ANGIOGRAPHY N/A 05/10/2021   Procedure: LEFT HEART CATH AND CORONARY ANGIOGRAPHY;  Surgeon: Marykay Lex, MD;  Location: [facility] INVASIVE CV LAB;  Service: Cardiovascular;  Laterality: N/A;    Current Medications: No outpatient medications have been marked as taking for the 06/08/21 encounter (Appointment) with Baldo Daub, MD.     Allergies:   Patient has no known allergies.   Social History   Socioeconomic History   Marital status: Married    Spouse name: Not on file   Number of children: Not on file   Years of education: Not on file   Highest education level: Not on file  Occupational History   Not on file  Tobacco Use   Smoking status: Every Day    Packs/day: 1.00    Years: 35.00    Pack years: 35.00    Types: Cigarettes    Passive exposure: Current   Smokeless tobacco: Never  Substance and Sexual Activity   Alcohol use: Not Currently   Drug use: Never   Sexual activity: Not on file  Other Topics Concern   Not on file  Social History Narrative   Not on file   Social Determinants of Health   Financial Resource Strain: Not on file  Food Insecurity: Not on file  Transportation Needs: Not on file  Physical Activity: Not on file  Stress: Not on file  Social Connections: Not on file     Family History: The patient's family history includes Alzheimer's disease in his paternal grandmother; Breast cancer in his paternal grandmother; Cancer in  his maternal grandfather and maternal grandmother; Coronary artery disease in his father; Emphysema in his mother; Heart attack in his paternal grandfather; Hypertension in his father. ROS:   Please see the history of present illness.    All other systems reviewed and are negative.  EKGs/Labs/Other Studies Reviewed:    The following studies were reviewed today:   Recent Labs: 03/19/2021: NT-Pro BNP 7 05/06/2021: BUN 17; Creatinine, Ser 0.99; Hemoglobin 16.1; Platelets 141; Potassium 4.5; Sodium 141  Recent Lipid Panel    Component Value Date/Time   CHOL 159 05/06/2021 0833   TRIG 340 (H) 05/06/2021 0833   HDL 34 (L) 05/06/2021 0833   CHOLHDL 4.7 05/06/2021 0833   LDLCALC 71 05/06/2021 0833    Physical Exam:    VS:  There were no vitals taken for this visit.    Wt Readings from Last 3 Encounters:  05/10/21 245 lb (111.1 kg)  05/06/21 244 lb 9.6 oz (110.9 kg)  03/19/21 246 lb 3.2 oz (111.7 kg)     GEN:  Well nourished, well developed in no acute distress  HEENT: Normal NECK: No JVD; No carotid bruits LYMPHATICS: No lymphadenopathy CARDIAC: RRR, no murmurs, rubs, gallops RESPIRATORY:  Clear to auscultation without rales, wheezing or rhonchi  ABDOMEN: Soft, non-tender, non-distended MUSCULOSKELETAL:  No edema; No deformity  SKIN: Warm and dry NEUROLOGIC:  Alert and oriented x 3 PSYCHIATRIC:  Normal affect    Signed, Norman Herrlich, MD  06/08/2021 7:50 AM    [facility]

## 2021-06-08 ENCOUNTER — Ambulatory Visit (INDEPENDENT_AMBULATORY_CARE_PROVIDER_SITE_OTHER): Payer: BC Managed Care – PPO | Admitting: Cardiology

## 2021-06-08 ENCOUNTER — Other Ambulatory Visit: Payer: Self-pay

## 2021-06-08 ENCOUNTER — Telehealth: Payer: Self-pay | Admitting: Cardiology

## 2021-06-08 ENCOUNTER — Encounter: Payer: Self-pay | Admitting: Cardiology

## 2021-06-08 VITALS — BP 146/72 | HR 90 | Ht 69.0 in | Wt 243.0 lb

## 2021-06-08 DIAGNOSIS — I10 Essential (primary) hypertension: Secondary | ICD-10-CM

## 2021-06-08 DIAGNOSIS — I25118 Atherosclerotic heart disease of native coronary artery with other forms of angina pectoris: Secondary | ICD-10-CM | POA: Diagnosis not present

## 2021-06-08 DIAGNOSIS — E782 Mixed hyperlipidemia: Secondary | ICD-10-CM | POA: Diagnosis not present

## 2021-06-08 DIAGNOSIS — J432 Centrilobular emphysema: Secondary | ICD-10-CM | POA: Diagnosis not present

## 2021-06-08 MED ORDER — NITROGLYCERIN 0.4 MG SL SUBL: 0.4000 mg | SUBLINGUAL_TABLET | SUBLINGUAL | 3 refills | Status: DC | PRN

## 2021-06-08 MED ORDER — DILTIAZEM HCL ER COATED BEADS 240 MG PO CP24: 240.0000 mg | ORAL_CAPSULE | Freq: Every day | ORAL | 3 refills | Status: DC

## 2021-06-08 MED ORDER — VASCEPA 0.5 G PO CAPS: 4.0000 | ORAL_CAPSULE | Freq: Two times a day (BID) | ORAL | 3 refills | Status: DC

## 2021-06-08 NOTE — Patient Instructions (Signed)
Medication Instructions:  Your physician recommends that you continue on your current medications as directed. Please refer to the Current Medication list given to you today.  *If you need a refill on your cardiac medications before your next appointment, please call your pharmacy*   Lab Work: None If you have labs (blood work) drawn today and your tests are completely normal, you will receive your results only by: MyChart Message (if you have MyChart) OR A paper copy in the mail If you have any lab test that is abnormal or we need to change your treatment, we will call you to review the results.   Testing/Procedures: None   Follow-Up: At [facility], you and your health needs are our priority.  As part of our continuing mission to provide you with exceptional heart care, we have created designated Provider Care Teams.  These Care Teams include your primary Cardiologist (physician) and Advanced Practice Providers (APPs -  Physician Assistants and Nurse Practitioners) who all work together to provide you with the care you need, when you need it.  We recommend signing up for the patient portal called "MyChart".  Sign up information is provided on this After Visit Summary.  MyChart is used to connect with patients for Virtual Visits (Telemedicine).  Patients are able to view lab/test results, encounter notes, upcoming appointments, etc.  Non-urgent messages can be sent to your provider as well.   To learn more about what you can do with MyChart, go to [email or web address].    Your next appointment:   4 month(s)  The format for your next appointment:   In Person  Provider:   Brian Munley, MD   Other Instructions   

## 2021-06-08 NOTE — Telephone Encounter (Signed)
Spoke to the patient just now and he requested that I sent in this prescription to [facility] in [place] at this time. It has been called in for him.    Encouraged patient to call back with any questions or concerns.

## 2021-06-08 NOTE — Telephone Encounter (Signed)
New Message:      Patient said he just talked to his pharmacist. He was told told that they did not carry Vascepa 0.5 mg, they did have the 1 mg.

## 2021-09-20 ENCOUNTER — Telehealth: Payer: Self-pay | Admitting: Cardiology

## 2021-09-20 NOTE — Telephone Encounter (Signed)
Spoke with pt who states that his BP has been elevated and cardiac rehab will fax his readings. Pt also states that he cannot afford Vacscepa as he lost his insurance. Advised to take 2 gm fish oil until he has insurance.

## 2021-09-20 NOTE — Telephone Encounter (Signed)
Pt c/o BP issue: STAT if pt c/o blurred vision, one-sided weakness or slurred speech  1. What are your last 5 BP readings?   2. Are you having any other symptoms (ex. Dizziness, headache, blurred vision, passed out)?  Low energy  3. What is your BP issue?   Patient states he was recently discharged from the hospital and his systolic # was around 220. He states over the past few days his systolic BP has been extremely elevated.

## 2021-10-21 NOTE — Progress Notes (Signed)
?Cardiology Office Note:   ? ?Date:  10/22/2021  ? ?ID:  Leroy Hart, DOB [birth year], MRN [id number] ? ?PCP:  Leroy Jones, NP  ?Cardiologist:  Leroy Herrlich, MD   ? ?Referring MD: Leroy Jones, NP  ? ? ?ASSESSMENT:   ? ?1. Coronary artery disease of native artery of native heart with stable angina pectoris (HCC)   ?2. Primary hypertension   ?3. Mixed hyperlipidemia   ?4. Centrilobular emphysema (HCC)   ? ?PLAN:   ? ?In order of problems listed above: ? ?He continues to do well having no anginal discomfort after PCI at 1 year we will drop aspirin continue clopidogrel [place] Heart Association class I continue his rate limiting calcium channel blocker antihypertensive and high intensity statin we will go ahead and check a lipid profile today last LDL September 2022 71 ?Stable emphysema ?September we will drop aspirin continue monotherapy with clopidogrel ? ? ?Next appointment: 9 months ? ? ?Medication Adjustments/Labs and Tests Ordered: ?Current medicines are reviewed at length with the patient today.  Concerns regarding medicines are outlined above.  ?No orders of the defined types were placed in this encounter. ? ?No orders of the defined types were placed in this encounter. ? ? ?Chief Complaint  ?Patient presents with  ? Follow-up  ? Coronary Artery Disease  ? ? ?History of Present Illness:   ? ?Leroy Hart is a 61 y.o. male with a hx of CAD hypertension hyperlipidemia and emphysema last seen 06/08/2021 following cardiac CTA showing multivessel CAD.  Subsequent coronary angiography with PCI and stenting of the right coronary artery and he had residual moderate mid LAD disease with borderline FFR without intervention.   He underwent PCI and stent to severe tandem lesions in the proximal mid right coronary artery 05/10/2021. ? ?Compliance with diet, lifestyle and medications: Yes ? ?Just passed a kidney stone ?With physical activity has mild shortness of breath related to COPD ?No edema  orthopnea palpitation or syncope ?No angina markedly improved quality of life ?He tolerates his statin without muscle pain or weakness ?More than 1 year from PCI we will drop aspirin and continue clopidogrel at 1 year ?Past Medical History:  ?Diagnosis Date  ? Chronic fatigue   ? Closed fracture of distal end of left fibula with routine healing 02/03/2016  ? Essential hypertension   ? Testicular hypofunction   ? Vitamin D deficiency   ? ? ?Past Surgical History:  ?Procedure Laterality Date  ? CORONARY STENT INTERVENTION N/A 05/10/2021  ? Procedure: CORONARY STENT INTERVENTION;  Surgeon: Marykay Lex, MD;  Location: [facility] INVASIVE CV LAB;  Service: Cardiovascular;  Laterality: N/A;  ? DERMATOLOGY    ? INTRAVASCULAR PRESSURE WIRE/FFR STUDY N/A 05/10/2021  ? Procedure: INTRAVASCULAR PRESSURE WIRE/FFR STUDY;  Surgeon: Marykay Lex, MD;  Location: [facility] INVASIVE CV LAB;  Service: Cardiovascular;  Laterality: N/A;  ? KNEE SURGERY Right   ? LEFT HEART CATH AND CORONARY ANGIOGRAPHY N/A 05/10/2021  ? Procedure: LEFT HEART CATH AND CORONARY ANGIOGRAPHY;  Surgeon: Marykay Lex, MD;  Location: [facility] INVASIVE CV LAB;  Service: Cardiovascular;  Laterality: N/A;  ? ? ?Current Medications: ?Current Meds  ?Medication Sig  ? aspirin EC 81 MG tablet Take 1 tablet (81 mg total) by mouth daily. Swallow whole.  ? clopidogrel (PLAVIX) 75 MG tablet Take 1 tablet (75 mg total) by mouth daily.  ? diltiazem (CARDIZEM CD) 240 MG 24 hr capsule Take 1 capsule (240 mg total) by mouth daily.  ?  losartan (COZAAR) 50 MG tablet Take 50 mg by mouth daily.  ? nitroGLYCERIN (NITROSTAT) 0.4 MG SL tablet Place 1 tablet (0.4 mg total) under the tongue every 5 (five) minutes as needed for chest pain.  ? rosuvastatin (CRESTOR) 20 MG tablet Take 1 tablet (20 mg total) by mouth daily.  ? SPIRIVA RESPIMAT 2.5 MCG/ACT AERS Inhale 2.5 mcg into the lungs daily.  ? Vitamin D3 (VITAMIN D) 25 MCG tablet Take 1,000 Units by mouth daily.  ?  ? ?Allergies:   Patient has  no known allergies.  ? ?Social History  ? ?Socioeconomic History  ? Marital status: Married  ?  Spouse name: Not on file  ? Number of children: Not on file  ? Years of education: Not on file  ? Highest education level: Not on file  ?Occupational History  ? Not on file  ?Tobacco Use  ? Smoking status: Every Day  ?  Packs/day: 1.00  ?  Years: 35.00  ?  Pack years: 35.00  ?  Types: Cigarettes  ?  Passive exposure: Current  ? Smokeless tobacco: Never  ?Substance and Sexual Activity  ? Alcohol use: Not Currently  ? Drug use: Never  ? Sexual activity: Not on file  ?Other Topics Concern  ? Not on file  ?Social History Narrative  ? Not on file  ? ?Social Determinants of Health  ? ?Financial Resource Strain: Not on file  ?Food Insecurity: Not on file  ?Transportation Needs: Not on file  ?Physical Activity: Not on file  ?Stress: Not on file  ?Social Connections: Not on file  ?  ? ?Family History: ?The patient's family history includes Alzheimer's disease in his paternal grandmother; Breast cancer in his paternal grandmother; Cancer in his maternal grandfather and maternal grandmother; Coronary artery disease in his father; Emphysema in his mother; Heart attack in his paternal grandfather; Hypertension in his father. ?ROS:   ?Please see the history of present illness.    ?All other systems reviewed and are negative. ? ?EKGs/Labs/Other Studies Reviewed:   ? ?The following studies were reviewed today: ?05/10/2021: ?Conclusion ?  Culprit Lesion Segment: Prox RCA lesion is 65% stenosed.  Prox RCA to Mid RCA lesion is 80% stenosed. ?  A drug-eluting stent was successfully placed using a SYNERGY XD 2.50X48. ->  Postdilated 2.75 mm ?  Post intervention, there is a 0% residual stenosis throughout the stented segment.. ?  ------------------------------------------------------------ ?  Prox LAD to Mid LAD lesion is 20% stenosed with 40% stenosed side branch in 1st Diag. ?  Mid LAD lesion is 60% stenosed with 35% stenosed side branch in  2nd Diag. - (RFR 0.91, FFR 0.82 --> Borderline, consistent with CT FFR) ?  Dist Cx lesion is 40% stenosed with 40% stenosed side branch in 3rd Mrg. ?  ------------------------------------------------------------ ?  The left ventricular systolic function is normal.  The left ventricular ejection fraction is 55-65% by visual estimate.  LV end diastolic pressure is normal ?  There is no aortic valve stenosis. ?  ?SUMMARY ?Two-vessel CAD ?Severe tandem lesions in the proximal to mid RCA (65% and 80%) lesions with documented positivity by CT FFR ?Successful DES PCI covering both lesions (using a single Synergy DES 2.5 mm x 48 mm postdilated to 2.75 mm.) ?Moderate mid LAD stenosis (CT FFR estimated 0.82) -> RFR 0.91-0.92, FFR 0.82 showing consistent borderline/nonischemic findings.  (Treat medically) ?Minimal disease in the AV groove circumflex ?Relatively normal LVEF with normal EDP. ?Coronary Diagrams ?  ?Diagnostic ?Dominance: Right ?  Intervention ?  ?    ?  ? ? ?Recent Labs: ?03/19/2021: NT-Pro BNP 7 ?05/06/2021: BUN 17; Creatinine, Ser 0.99; Hemoglobin 16.1; Platelets 141; Potassium 4.5; Sodium 141  ?Recent Lipid Panel ?   ?Component Value Date/Time  ? CHOL 159 05/06/2021 0833  ? TRIG 340 (H) 05/06/2021 9562  ? HDL 34 (L) 05/06/2021 1308  ? CHOLHDL 4.7 05/06/2021 0833  ? LDLCALC 71 05/06/2021 0833  ? ? ?Physical Exam:   ? ?VS:  BP 136/82 (BP Location: Right Arm)   Pulse 84   Ht 5\' 9"  (1.753 m)   Wt 245 lb (111.1 kg)   SpO2 97%   BMI 36.18 kg/m?    ? ?Wt Readings from Last 3 Encounters:  ?10/22/21 245 lb (111.1 kg)  ?06/08/21 243 lb (110.2 kg)  ?05/10/21 245 lb (111.1 kg)  ?  ? ?GEN:  Well nourished, well developed in no acute distress ?HEENT: Normal ?NECK: No JVD; No carotid bruits ?LYMPHATICS: No lymphadenopathy ?CARDIAC: RRR, no murmurs, rubs, gallops ?RESPIRATORY:  Clear to auscultation without rales, wheezing or rhonchi hyperinflated chest ?ABDOMEN: Soft, non-tender, non-distended ?MUSCULOSKELETAL:  No edema;  No deformity  ?SKIN: Warm and dry ?NEUROLOGIC:  Alert and oriented x 3 ?PSYCHIATRIC:  Normal affect  ? ? ?Signed, ?05/12/21, MD  ?10/22/2021 7:52 AM    ?[facility]  ?

## 2021-10-22 ENCOUNTER — Ambulatory Visit (INDEPENDENT_AMBULATORY_CARE_PROVIDER_SITE_OTHER): Payer: Self-pay | Admitting: Cardiology

## 2021-10-22 ENCOUNTER — Encounter: Payer: Self-pay | Admitting: Cardiology

## 2021-10-22 ENCOUNTER — Other Ambulatory Visit: Payer: Self-pay

## 2021-10-22 VITALS — BP 136/82 | HR 84 | Ht 69.0 in | Wt 245.0 lb

## 2021-10-22 DIAGNOSIS — I10 Essential (primary) hypertension: Secondary | ICD-10-CM

## 2021-10-22 DIAGNOSIS — J432 Centrilobular emphysema: Secondary | ICD-10-CM

## 2021-10-22 DIAGNOSIS — I25118 Atherosclerotic heart disease of native coronary artery with other forms of angina pectoris: Secondary | ICD-10-CM

## 2021-10-22 DIAGNOSIS — E782 Mixed hyperlipidemia: Secondary | ICD-10-CM

## 2021-10-22 NOTE — Patient Instructions (Addendum)
Medication Instructions:  ? ? STAY ON ASPIRIN UNTIL  September 2023  ? ?*If you need a refill on your cardiac medications before your next appointment, please call your pharmacy* ? ? ?Lab Work:   CMET AND LIPIDS  ? ? ?If you have labs (blood work) drawn today and your tests are completely normal, you will receive your results only by: ?MyChart Message (if you have MyChart) OR ?A paper copy in the mail ?If you have any lab test that is abnormal or we need to change your treatment, we will call you to review the results. ? ? ?Testing/Procedures: NONE ORDERED  TODAY ? ? ? ? ?Follow-Up: ?At [facility], you and your health needs are our priority.  As part of our continuing mission to provide you with exceptional heart care, we have created designated Provider Care Teams.  These Care Teams include your primary Cardiologist (physician) and Advanced Practice Providers (APPs -  Physician Assistants and Nurse Practitioners) who all work together to provide you with the care you need, when you need it. ? ?We recommend signing up for the patient portal called "MyChart".  Sign up information is provided on this After Visit Summary.  MyChart is used to connect with patients for Virtual Visits (Telemedicine).  Patients are able to view lab/test results, encounter notes, upcoming appointments, etc.  Non-urgent messages can be sent to your provider as well.   ?To learn more about what you can do with MyChart, go to [email or web address].   ? ?Your next appointment:   ?9 month(s) ? ?The format for your next appointment:   ?In Person ? ?Provider:   ?Norman Herrlich, MD  ? ? ?Other Instructions ? ?

## 2021-10-23 LAB — LIPID PANEL
Chol/HDL Ratio: 4 ratio (ref 0.0–5.0)
Cholesterol, Total: 144 mg/dL (ref 100–199)
HDL: 36 mg/dL — ABNORMAL LOW (ref >39)
LDL Chol Calc (NIH): 73 mg/dL (ref 0–99)
Triglycerides: 211 mg/dL — ABNORMAL HIGH (ref 0–149)
VLDL Cholesterol Cal: 35 mg/dL (ref 5–40)

## 2021-10-23 LAB — COMPREHENSIVE METABOLIC PANEL
ALT: 18 IU/L (ref 0–44)
AST: 14 IU/L (ref 0–40)
Albumin/Globulin Ratio: 1.5 (ref 1.2–2.2)
Albumin: 4.2 g/dL (ref 3.8–4.9)
Alkaline Phosphatase: 95 IU/L (ref 44–121)
BUN/Creatinine Ratio: 18 (ref 9–20)
BUN: 16 mg/dL (ref 6–24)
Bilirubin Total: <0.2 mg/dL (ref 0.0–1.2)
CO2: 24 mmol/L (ref 20–29)
Calcium: 10 mg/dL (ref 8.7–10.2)
Chloride: 106 mmol/L (ref 96–106)
Creatinine, Ser: 0.89 mg/dL (ref 0.76–1.27)
Globulin, Total: 2.8 g/dL (ref 1.5–4.5)
Glucose: 95 mg/dL (ref 70–99)
Potassium: 4 mmol/L (ref 3.5–5.2)
Sodium: 141 mmol/L (ref 134–144)
Total Protein: 7 g/dL (ref 6.0–8.5)
eGFR: 99 mL/min/{1.73_m2} (ref >59)

## 2021-12-14 ENCOUNTER — Telehealth: Payer: Self-pay | Admitting: Cardiology

## 2021-12-14 NOTE — Telephone Encounter (Signed)
Patient called to say that his insurance need a copy of his update records. Please advise  ?

## 2022-02-01 DIAGNOSIS — E785 Hyperlipidemia, unspecified: Secondary | ICD-10-CM | POA: Insufficient documentation

## 2022-02-01 DIAGNOSIS — Z95820 Peripheral vascular angioplasty status with implants and grafts: Secondary | ICD-10-CM

## 2022-02-01 DIAGNOSIS — I700 Atherosclerosis of aorta: Secondary | ICD-10-CM | POA: Insufficient documentation

## 2022-02-01 HISTORY — DX: Atherosclerosis of aorta: I70.0

## 2022-02-01 HISTORY — DX: Hyperlipidemia, unspecified: E78.5

## 2022-02-01 HISTORY — DX: Peripheral vascular angioplasty status with implants and grafts: Z95.820

## 2022-04-29 ENCOUNTER — Other Ambulatory Visit: Payer: Self-pay | Admitting: Cardiology

## 2022-05-11 ENCOUNTER — Other Ambulatory Visit: Payer: Self-pay

## 2022-05-11 MED ORDER — CLOPIDOGREL BISULFATE 75 MG PO TABS: 75.0000 mg | ORAL_TABLET | Freq: Every day | ORAL | 3 refills | Status: DC

## 2022-06-21 ENCOUNTER — Telehealth: Payer: Self-pay | Admitting: Cardiology

## 2022-06-21 MED ORDER — ROSUVASTATIN CALCIUM 20 MG PO TABS: 20.0000 mg | ORAL_TABLET | Freq: Every day | ORAL | 3 refills | Status: DC

## 2022-06-21 NOTE — Telephone Encounter (Signed)
Medication sent.

## 2022-06-21 NOTE — Telephone Encounter (Signed)
Patient stated he needs refill on cholesterol medication sent to [facility] [facility]. He stated he has been out of this medication for several weeks.

## 2022-07-25 NOTE — Progress Notes (Unsigned)
Cardiology Office Note:    Date:  07/26/2022   ID:  Leroy Hart, DOB 1961-05-09, MRN [id number]  PCP:  Eber Jones, NP  Cardiologist:  Norman Herrlich, MD    Referring MD: Eber Jones, NP    ASSESSMENT:    1. Coronary artery disease of native artery of native heart with stable angina pectoris (HCC)   2. Primary hypertension   3. Mixed hyperlipidemia   4. Centrilobular emphysema (HCC)    PLAN:    In order of problems listed above:  Leroy Hart has done well following PCI and stent more than a year ago no anginal discomfort he will continue treatment including clopidogrel monotherapy as high intensity statin and diltiazem for hypertension and antianginal effect.  At this time I would not pursue an ischemia evaluation Well-controlled on his ARB consistently at home with a valid device and good technique less than 120/80 Continue his high intensity statin LDL is at target More symptomatic with COPD bronchospasm and secretions given advised to use a spacer with his MDI start use of flutter valve and when worsened to use a nebulizer bronchodilator that generally is more effective with exacerbation of COPD.   Next appointment: 1 year   Medication Adjustments/Labs and Tests Ordered: Current medicines are reviewed at length with the patient today.  Concerns regarding medicines are outlined above.  No orders of the defined types were placed in this encounter.  No orders of the defined types were placed in this encounter.   Chief Complaint  Patient presents with   Follow-up   Coronary Artery Disease    History of Present Illness:    LISTER Hart is a 61 y.o. male with a hx of CAD with PCI and stenting right coronary artery 05/10/2021 hypertension hyperlipidemia and emphysema last seen 10/22/2021. Lipid profile 11/01/2021 cholesterol 147 LDL 71 triglycerides 194 HDL 37 non-HDL cholesterol 110  Compliance with diet, lifestyle and medications: Yes  From a  cardiology perspective doing well tolerates his guideline directed therapy with clopidogrel antiplatelet blood pressure at home runs less than 120/80 consistently and tolerates lipid-lowering therapy with LDL at target. He is having no anginal discomfort palpitations syncope edema orthopnea He is struggling with his COPD is more bronchospastic he has a nebulizer at home but he is not using it.  He has thick secretions have asked him to start using Mucinex daily to use his nebulizer when he is worsened to get a spacer to use with his MDI inhaler and I think he benefit from a flutter valve to mobilize secretions. He is in the process of disability determination Past Medical History:  Diagnosis Date   Chronic fatigue    Closed fracture of distal end of left fibula with routine healing 02/03/2016   Essential hypertension    Testicular hypofunction    Vitamin D deficiency     Past Surgical History:  Procedure Laterality Date   CORONARY STENT INTERVENTION N/A 05/10/2021   Procedure: CORONARY STENT INTERVENTION;  Surgeon: Marykay Lex, MD;  Location: [facility] INVASIVE CV LAB;  Service: Cardiovascular;  Laterality: N/A;   DERMATOLOGY     INTRAVASCULAR PRESSURE WIRE/FFR STUDY N/A 05/10/2021   Procedure: INTRAVASCULAR PRESSURE WIRE/FFR STUDY;  Surgeon: Marykay Lex, MD;  Location: [facility] INVASIVE CV LAB;  Service: Cardiovascular;  Laterality: N/A;   KNEE SURGERY Right    LEFT HEART CATH AND CORONARY ANGIOGRAPHY N/A 05/10/2021   Procedure: LEFT HEART CATH AND CORONARY ANGIOGRAPHY;  Surgeon: Marykay Lex, MD;  Location: [facility] INVASIVE CV LAB;  Service: Cardiovascular;  Laterality: N/A;    Current Medications: Current Meds  Medication Sig   clopidogrel (PLAVIX) 75 MG tablet Take 1 tablet (75 mg total) by mouth daily.   diltiazem (CARDIZEM CD) 240 MG 24 hr capsule TAKE 1 CAPSULE BY MOUTH DAILY   losartan (COZAAR) 50 MG tablet Take 50 mg by mouth daily.   nitroGLYCERIN (NITROSTAT) 0.4 MG SL tablet Place  1 tablet (0.4 mg total) under the tongue every 5 (five) minutes as needed for chest pain.   rosuvastatin (CRESTOR) 20 MG tablet Take 1 tablet (20 mg total) by mouth daily.   Vitamin D3 (VITAMIN D) 25 MCG tablet Take 1,000 Units by mouth daily.     Allergies:   Patient has no known allergies.   Social History   Socioeconomic History   Marital status: Married    Spouse name: Not on file   Number of children: Not on file   Years of education: Not on file   Highest education level: Not on file  Occupational History   Not on file  Tobacco Use   Smoking status: Every Day    Packs/day: 1.00    Years: 35.00    Total pack years: 35.00    Types: Cigarettes    Passive exposure: Current   Smokeless tobacco: Never  Substance and Sexual Activity   Alcohol use: Not Currently   Drug use: Never   Sexual activity: Not on file  Other Topics Concern   Not on file  Social History Narrative   Not on file   Social Determinants of Health   Financial Resource Strain: Not on file  Food Insecurity: Not on file  Transportation Needs: Not on file  Physical Activity: Not on file  Stress: Not on file  Social Connections: Not on file     Family History: The patient's family history includes Alzheimer's disease in his paternal grandmother; Breast cancer in his paternal grandmother; Cancer in his maternal grandfather and maternal grandmother; Coronary artery disease in his father; Emphysema in his mother; Heart attack in his paternal grandfather; Hypertension in his father. ROS:   Please see the history of present illness.    All other systems reviewed and are negative.  EKGs/Labs/Other Studies Reviewed:    The following studies were reviewed today:  EKG:  EKG ordered today and personally reviewed.  The ekg ordered today demonstrates sinus rhythm and is normal  Recent Labs: 10/22/2021: ALT 18; BUN 16; Creatinine, Ser 0.89; Potassium 4.0; Sodium 141  Recent Lipid Panel    Component Value  Date/Time   CHOL 144 10/22/2021 0815   TRIG 211 (H) 10/22/2021 0815   HDL 36 (L) 10/22/2021 0815   CHOLHDL 4.0 10/22/2021 0815   LDLCALC 73 10/22/2021 0815    Physical Exam:    VS:  BP (!) 142/76   Pulse 82   Ht 5\' 8"  (1.727 m)   Wt 246 lb 12.8 oz (111.9 kg)   SpO2 97%   BMI 37.53 kg/m     Wt Readings from Last 3 Encounters:  07/26/22 246 lb 12.8 oz (111.9 kg)  10/22/21 245 lb (111.1 kg)  06/08/21 243 lb (110.2 kg)     GEN:  Well nourished, well developed in no acute distress HEENT: Normal NECK: No JVD; No carotid bruits LYMPHATICS: No lymphadenopathy CARDIAC: Distant heart sounds RRR, no murmurs, rubs, gallops RESPIRATORY: Hyperinflated prolonged expiratory phase ABDOMEN: Soft, non-tender, non-distended MUSCULOSKELETAL:  No edema; No deformity  SKIN:  Warm and dry NEUROLOGIC:  Alert and oriented x 3 PSYCHIATRIC:  Normal affect    Signed, Norman Herrlich, MD  07/26/2022 9:01 AM    [facility]

## 2022-07-26 ENCOUNTER — Ambulatory Visit: Payer: Self-pay | Attending: Cardiology | Admitting: Cardiology

## 2022-07-26 ENCOUNTER — Encounter: Payer: Self-pay | Admitting: Cardiology

## 2022-07-26 VITALS — BP 142/76 | HR 82 | Ht 68.0 in | Wt 246.8 lb

## 2022-07-26 DIAGNOSIS — I25118 Atherosclerotic heart disease of native coronary artery with other forms of angina pectoris: Secondary | ICD-10-CM

## 2022-07-26 DIAGNOSIS — I10 Essential (primary) hypertension: Secondary | ICD-10-CM

## 2022-07-26 DIAGNOSIS — J432 Centrilobular emphysema: Secondary | ICD-10-CM

## 2022-07-26 DIAGNOSIS — E782 Mixed hyperlipidemia: Secondary | ICD-10-CM

## 2022-07-26 NOTE — Patient Instructions (Signed)
Medication Instructions:  Your physician recommends that you continue on your current medications as directed. Please refer to the Current Medication list given to you today.  *If you need a refill on your cardiac medications before your next appointment, please call your pharmacy*   Lab Work: None If you have labs (blood work) drawn today and your tests are completely normal, you will receive your results only by: MyChart Message (if you have MyChart) OR A paper copy in the mail If you have any lab test that is abnormal or we need to change your treatment, we will call you to review the results.   Testing/Procedures: None   Follow-Up: At [facility], you and your health needs are our priority.  As part of our continuing mission to provide you with exceptional heart care, we have created designated Provider Care Teams.  These Care Teams include your primary Cardiologist (physician) and Advanced Practice Providers (APPs -  Physician Assistants and Nurse Practitioners) who all work together to provide you with the care you need, when you need it.  We recommend signing up for the patient portal called "MyChart".  Sign up information is provided on this After Visit Summary.  MyChart is used to connect with patients for Virtual Visits (Telemedicine).  Patients are able to view lab/test results, encounter notes, upcoming appointments, etc.  Non-urgent messages can be sent to your provider as well.   To learn more about what you can do with MyChart, go to [email or web address].    Your next appointment:   1 year(s)  The format for your next appointment:   In Person  Provider:   Norman Herrlich, MD    Other Instructions Purchase a spacer and a flutter valve for albuterol inhaler.  Use Muccinex daily  Important Information About Sugar

## 2023-04-30 ENCOUNTER — Other Ambulatory Visit: Payer: Self-pay | Admitting: Cardiology

## 2023-05-01 NOTE — Telephone Encounter (Signed)
This is Dr. Hulen Shouts pt. Please refill thanks

## 2023-07-10 ENCOUNTER — Other Ambulatory Visit: Payer: Self-pay | Admitting: Cardiology

## 2023-08-07 ENCOUNTER — Other Ambulatory Visit: Payer: Self-pay | Admitting: Cardiology

## 2023-08-08 ENCOUNTER — Other Ambulatory Visit: Payer: Self-pay | Admitting: Cardiology

## 2023-08-28 ENCOUNTER — Other Ambulatory Visit: Payer: Self-pay | Admitting: Cardiology

## 2023-09-25 ENCOUNTER — Other Ambulatory Visit: Payer: Self-pay | Admitting: Cardiology

## 2023-09-25 NOTE — Telephone Encounter (Signed)
 Rx refill sent to pharmacy.

## 2023-10-17 ENCOUNTER — Other Ambulatory Visit: Payer: Self-pay

## 2023-10-17 MED ORDER — CLOPIDOGREL BISULFATE 75 MG PO TABS: 75.0000 mg | ORAL_TABLET | Freq: Every day | ORAL | 0 refills | Status: DC

## 2023-10-17 MED ORDER — ROSUVASTATIN CALCIUM 20 MG PO TABS: 20.0000 mg | ORAL_TABLET | Freq: Every day | ORAL | 0 refills | Status: DC

## 2023-10-17 MED ORDER — DILTIAZEM HCL ER COATED BEADS 240 MG PO CP24: 240.0000 mg | ORAL_CAPSULE | Freq: Every day | ORAL | 0 refills | Status: DC

## 2023-10-17 NOTE — Telephone Encounter (Signed)
 Prescription sent to pharmacy.

## 2023-10-24 ENCOUNTER — Encounter: Payer: Self-pay | Admitting: Cardiology

## 2023-10-24 NOTE — Progress Notes (Deleted)
 Cardiology Office Note:    Date:  10/24/2023   ID:  Leroy Hart, DOB 1961/01/10, MRN [id number]  PCP:  Alveria Apley, NP (Inactive)  Cardiologist:  Norman Herrlich, MD    Referring MD: No ref. provider found    ASSESSMENT:    1. Coronary artery disease of native artery of native heart with stable angina pectoris (HCC)   2. Primary hypertension   3. Mixed hyperlipidemia   4. Centrilobular emphysema (HCC)    PLAN:    In order of problems listed above:  ***   Next appointment: ***   Medication Adjustments/Labs and Tests Ordered: Current medicines are reviewed at length with the patient today.  Concerns regarding medicines are outlined above.  No orders of the defined types were placed in this encounter.  No orders of the defined types were placed in this encounter.    History of Present Illness:    Leroy Hart is a 63 y.o. male with a hx of CAD with PCI and stenting right coronary artery September 2022 hypertension hyperlipidemia and emphysema last seen 07/26/2022. Compliance with diet, lifestyle and medications: *** Past Medical History:  Diagnosis Date   Abnormal cardiac CT angiography 05/10/2021   Angina, class III (HCC) 05/10/2021   Centrilobular emphysema (HCC) 03/23/2021   Formatting of this note might be different from the original.  Chest CT 01/21/2021     Chronic fatigue    Cigarette smoker 03/23/2021   Closed fracture of distal end of left fibula with routine healing 02/03/2016   Coronary artery disease of native artery of native heart with stable angina pectoris (HCC)    Dyspnea on exertion 03/23/2021   Essential hypertension    Hardening of the aorta (main artery of the heart) (HCC) 02/01/2022   Hyperlipidemia 02/01/2022   OSA (obstructive sleep apnea) 05/04/2021   Formatting of this note might be different from the original.  HST 05/03/2021, AHI 8.0     Status post angioplasty with stent 02/01/2022   05/10/2021     Testicular  hypofunction    Vitamin D deficiency     Current Medications: Current Meds  Medication Sig   aspirin EC 81 MG tablet Take 81 mg by mouth daily.   diltiazem (TIAZAC) 240 MG 24 hr capsule Take 240 mg by mouth daily.   hydrochlorothiazide (HYDRODIURIL) 12.5 MG tablet Take 12.5 mg by mouth daily.   metoprolol tartrate (LOPRESSOR) 100 MG tablet Take 100 mg by mouth daily.      EKGs/Labs/Other Studies Reviewed:    The following studies were reviewed today:  Cardiac Studies & Procedures   ______________________________________________________________________________________________ CARDIAC CATHETERIZATION  CARDIAC CATHETERIZATION 05/10/2021  Narrative   Culprit Lesion Segment: Prox RCA lesion is 65% stenosed.  Prox RCA to Mid RCA lesion is 80% stenosed.   A drug-eluting stent was successfully placed using a SYNERGY XD 2.50X48. ->  Postdilated 2.75 mm   Post intervention, there is a 0% residual stenosis throughout the stented segment.Marland Kitchen   ------------------------------------------------------------   Prox LAD to Mid LAD lesion is 20% stenosed with 40% stenosed side branch in 1st Diag.   Mid LAD lesion is 60% stenosed with 35% stenosed side branch in 2nd Diag. - (RFR 0.91, FFR 0.82 --> Borderline, consistent with CT FFR)   Dist Cx lesion is 40% stenosed with 40% stenosed side branch in 3rd Mrg.   ------------------------------------------------------------   The left ventricular systolic function is normal.  The left ventricular ejection fraction is 55-65% by visual estimate.  LV  end diastolic pressure is normal   There is no aortic valve stenosis.  SUMMARY Two-vessel CAD Severe tandem lesions in the proximal to mid RCA (65% and 80%) lesions with documented positivity by CT FFR Successful DES PCI covering both lesions (using a single Synergy DES 2.5 mm x 48 mm postdilated to 2.75 mm.) Moderate mid LAD stenosis (CT FFR estimated 0.82) -> RFR 0.91-0.92, FFR 0.82 showing consistent  borderline/nonischemic findings.  (Treat medically) Minimal disease in the AV groove circumflex Relatively normal LVEF with normal EDP.   RECOMMENDATIONS Discharge to home after PACU -plan same-day discharge-6 hours DAPT x6 months uninterrupted, then continue Plavix monotherapy for at least 2 total years given the extent of RCA stent and LAD disease Increase rosuvastatin 40 mg daily and continue other home medications -> currently on diltiazem as opposed to beta-blocker, will defer to primary cardiologist   Bryan Lemma, MD  Findings Coronary Findings Diagnostic  Dominance: Right  Left Main Vessel was injected. Vessel is normal in caliber. Vessel is angiographically normal.  Left Anterior Descending Prox LAD to Mid LAD lesion is 20% stenosed with 40% stenosed side branch in 1st Diag. The lesion is located at the bifurcation. Mid LAD lesion is 60% stenosed with 35% stenosed side branch in 2nd Diag. Vessel is not the culprit lesion. The lesion is located at the bend, focal and eccentric. At major branch Pressure wire/FFR was performed on the lesion. FFR: 0.82. Pressure X wire: RFR 0.91-0.92. RFR/FFR: XB LAD 3.5 guide catheter -&gt; pressure X wire; adenosine infused at 140 mcg/kg/min x2 min End Result-Borderline, Nonischemic -&gt; recommend medical management  Second Diagonal Branch Vessel is small in size.  Left Circumflex Vessel is large. There is mild diffuse disease throughout the vessel. Dist Cx lesion is 40% stenosed with 40% stenosed side branch in 3rd Mrg. The lesion is located at the bifurcation.  First Obtuse Marginal Branch Vessel is moderate in size.  Second Obtuse Marginal Branch Vessel is moderate in size The vessel exhibits minimal luminal irregularities.  Third Obtuse Marginal Branch Vessel is moderate in size Vessel is angiographically normal.  First Left Posterolateral Branch Vessel is moderate in size.  Second Left Posterolateral Branch Vessel is  moderate in size.  Left Posterior Atrioventricular Artery Vessel is moderate in size.  Right Coronary Artery Vessel was injected. Vessel is normal in caliber. There is mild diffuse disease throughout the vessel. There is severe focal disease in the vessel. Prox RCA lesion is 65% stenosed. The lesion is eccentric. Prox RCA to Mid RCA lesion is 80% stenosed. The lesion is segmental, eccentric and irregular.  Acute Marginal Branch Vessel is small in size.  Right Ventricular Branch Vessel is small in size.  Right Posterior Descending Artery Vessel is small in size.  First Right Posterolateral Branch Vessel is small in size.  Intervention  Prox RCA lesion Stent (Also treats lesions: Prox RCA to Mid RCA) Lesion length:  46 mm. CATH VISTA GUIDE 6FR JR4 guide catheter was inserted. Lesion crossed with guidewire using a WIRE ASAHI PROWATER 180CM. Pre-stent angioplasty was performed using a BALLN SAPPHIRE 2.25X15. Maximum pressure:  8 atm. Inflation time:  20 sec. 3 total inflations A drug-eluting stent was successfully placed using a SYNERGY XD 2.50X48. Maximum pressure: 14 atm. Inflation time: 30 sec. Stent strut is well apposed. Postdilated to 2.75 mm Post-stent angioplasty was performed. Maximum pressure:  18 atm. Inflation time:  20 sec. Stent balloon, high ATM Post-Intervention Lesion Assessment The intervention was successful. Pre-interventional TIMI flow is  3. Post-intervention TIMI flow is 3. Treated lesion length:  48 mm. There is a 0% residual stenosis post intervention.  Prox RCA to Mid RCA lesion Stent (Also treats lesions: Prox RCA) See details in Prox RCA lesion. Post-Intervention Lesion Assessment The intervention was successful. Pre-interventional TIMI flow is 3. Post-intervention TIMI flow is 3. Treated lesion length:  48 mm. No complications occurred at this lesion. There is a 0% residual stenosis post intervention.     ECHOCARDIOGRAM  ECHOCARDIOGRAM COMPLETE  06/02/2021      CT SCANS  CT CORONARY FRACTIONAL FLOW RESERVE DATA PREP 05/03/2021  Narrative EXAM: FFRCT ANALYSIS  FINDINGS: FFRct analysis was performed on the original cardiac CT angiogram dataset. Diagrammatic representation of the FFRct analysis is provided in a separate PDF document in PACS. This dictation was created using the PDF document and an interactive 3D model of the results. 3D model is not available in the EMR/PACS. Normal FFR range is >0.80.  1. LM: 0.99  2. LAD: Prox 0.94, mid (distal from the lesion) 0.82, distal 0.72 3. LCX: Prox 0.94, mid 0.85, distal 0.79 4. RCA: Prox 0.99, mid (distal from the lesion) 0.79, distal 0.76  IMPRESSION: Lesion seen in mid LAD is hemodynamically not significant.  Lesion noted in mid portion of RCA has borderline hemodynamic characteristic.  If pt is symptomatic consider cardiac catheterization.   Electronically Signed By: Gypsy Balsam M.D. On: 05/03/2021 18:01   CT SCANS  CT CORONARY MORPH W/CTA COR W/SCORE 04/30/2021  Addendum 04/30/2021  6:26 PM ADDENDUM REPORT: 04/30/2021 18:23  CLINICAL DATA:  Atypical chestpain  EXAM: Cardiac/Coronary  CTA  TECHNIQUE: The patient was scanned on a [employer].  FINDINGS: A 120 kV prospective scan was triggered in the descending thoracic aorta at 111 HU's. Axial non-contrast 3 mm slices were carried out through the heart. The data set was analyzed on a dedicated work station and scored using the Agatson method. Gantry rotation speed was 250 msecs and collimation was .6 mm. No beta blockade and 0.8 mg of sl NTG was given. The 3D data set was reconstructed in 5% intervals of the 67-82 % of the R-R cycle. Diastolic phases were analyzed on a dedicated work station using MPR, MIP and VRT modes. The patient received 80 cc of contrast.  Aorta: Mildly enlarged ascending aorta - 38 mm. No calcifications. No dissection.  Aortic Valve:  Trileaflet.  No  calcifications.  Coronary Arteries:  Normal coronary origin.  Right dominance.  RCA is a large dominant artery that gives rise to PDA and PLA. There is soft moderate (50-70% stenosis), plaque in the mid portion of RCA  Left main is a moderate size artery that gives rise to LAD, LCX arteries as well as to small intermediate branch.  LAD is a large vessel. There is a calcified plaque that extends into left main coronary artery. This plaque create moderate stenosis (50-70%) of the proximal portion of the LAD. In the mid portion of LAD small minimal (0-24% stenosis) non obstructive calcified plaque is noted. LAD gives rise to moderate size D1. D1 has mild calcified, non obstructive plaques. Small D2 is noted and is free of disease.  LCX is a non-dominant artery that gives rise to moderate size OM1 branch as well as moderate size OM2. There are soft, mild (25-49%) non obstructive plaques noted in mid portion of CX  Other findings:  Normal pulmonary vein drainage into the left atrium.  Normal left atrial appendage without a thrombus.  Normal  size of the pulmonary artery.  IMPRESSION: 1. Coronary calcium score: Not performed.  2. Normal coronary origin with right dominance.  3. CAD-RADS 3. Moderate stenosis. Consider symptom-guided anti-ischemic pharmacotherapy as well as risk factor modification per guideline directed care. Additional analysis with CT FFR will be submitted.   Electronically Signed By: Gypsy Balsam M.D. On: 04/30/2021 18:23  Narrative EXAM: OVER-READ INTERPRETATION  CT CHEST  The following report is an over-read performed by radiologist Dr. Jeronimo Greaves of [facility] on 04/30/2021. This over-read does not include interpretation of cardiac or coronary anatomy or pathology. The coronary CTA interpretation by the cardiologist is attached.  COMPARISON:  01/21/2021 CTA chest  FINDINGS: Vascular: Aortic atherosclerosis. No central pulmonary  embolism, on this non-dedicated study.  Mediastinum/Nodes: No imaged thoracic adenopathy.  Lungs/Pleura: No pleural fluid. No lobar consolidation. Bilateral subpleural predominant pulmonary nodules including up to 6 mm are similar back to 01/19/2021.  Upper Abdomen: Normal imaged portions of the liver, spleen, stomach.  Musculoskeletal: No acute osseous abnormality.  IMPRESSION: 1.  No acute findings in the imaged extracardiac chest. 2.  Aortic Atherosclerosis (ICD10-I70.0). 3. Bilateral pulmonary nodules are subpleural predominant and similar back for 3 months. Per Fleischner criteria, follow-up chest CT at 15-21 months is considered optional for low risk patients, but recommended for high risk patients. This recommendation follows the consensus statement: Guidelines for Management of Incidental Pulmonary Nodules Detected on CT Images:From the Fleischner Society 2017; published online before print (10.1148/radiol.[id number]).  Electronically Signed: By: Jeronimo Greaves M.D. On: 04/30/2021 13:25     ______________________________________________________________________________________________          Recent Labs: No results found for requested labs within last 365 days.  Recent Lipid Panel    Component Value Date/Time   CHOL 144 10/22/2021 0815   TRIG 211 (H) 10/22/2021 0815   HDL 36 (L) 10/22/2021 0815   CHOLHDL 4.0 10/22/2021 0815   LDLCALC 73 10/22/2021 0815    Physical Exam:    VS:  There were no vitals taken for this visit.    Wt Readings from Last 3 Encounters:  07/26/22 246 lb 12.8 oz (111.9 kg)  10/22/21 245 lb (111.1 kg)  06/08/21 243 lb (110.2 kg)     GEN: *** Well nourished, well developed in no acute distress HEENT: Normal NECK: No JVD; No carotid bruits LYMPHATICS: No lymphadenopathy CARDIAC: ***RRR, no murmurs, rubs, gallops RESPIRATORY:  Clear to auscultation without rales, wheezing or rhonchi  ABDOMEN: Soft, non-tender,  non-distended MUSCULOSKELETAL:  No edema; No deformity  SKIN: Warm and dry NEUROLOGIC:  Alert and oriented x 3 PSYCHIATRIC:  Normal affect    Signed, Norman Herrlich, MD  10/24/2023 3:00 PM    [facility]

## 2023-10-25 ENCOUNTER — Ambulatory Visit: Payer: Self-pay | Admitting: Cardiology

## 2023-10-25 DIAGNOSIS — J432 Centrilobular emphysema: Secondary | ICD-10-CM

## 2023-10-25 DIAGNOSIS — I25118 Atherosclerotic heart disease of native coronary artery with other forms of angina pectoris: Secondary | ICD-10-CM

## 2023-10-25 DIAGNOSIS — I10 Essential (primary) hypertension: Secondary | ICD-10-CM

## 2023-10-25 DIAGNOSIS — E782 Mixed hyperlipidemia: Secondary | ICD-10-CM

## 2023-10-28 NOTE — Progress Notes (Unsigned)
 Cardiology Office Note:    Date:  10/28/2023   ID:  Leroy Hart, DOB September 10, 1960, MRN [id number]  PCP:  Alveria Apley, NP (Inactive)  Cardiologist:  Norman Herrlich, MD    Referring MD: No ref. provider found    ASSESSMENT:    1. Coronary artery disease of native artery of native heart with stable angina pectoris (HCC)   2. Primary hypertension   3. Mixed hyperlipidemia   4. Centrilobular emphysema (HCC)     PLAN:    In order of problems listed above:  ***   Next appointment: ***   Medication Adjustments/Labs and Tests Ordered: Current medicines are reviewed at length with the patient today.  Concerns regarding medicines are outlined above.  No orders of the defined types were placed in this encounter.  No orders of the defined types were placed in this encounter.    History of Present Illness:    Leroy Hart is a 63 y.o. male with a hx of CAD with PCI and stenting right coronary artery September 2022 hypertension hyperlipidemia and emphysema last seen 07/26/2022. Compliance with diet, lifestyle and medications: *** Past Medical History:  Diagnosis Date   Abnormal cardiac CT angiography 05/10/2021   Angina, class III (HCC) 05/10/2021   Centrilobular emphysema (HCC) 03/23/2021   Formatting of this note might be different from the original.  Chest CT 01/21/2021     Chronic fatigue    Cigarette smoker 03/23/2021   Closed fracture of distal end of left fibula with routine healing 02/03/2016   Coronary artery disease of native artery of native heart with stable angina pectoris (HCC)    Dyspnea on exertion 03/23/2021   Essential hypertension    Hardening of the aorta (main artery of the heart) (HCC) 02/01/2022   Hyperlipidemia 02/01/2022   OSA (obstructive sleep apnea) 05/04/2021   Formatting of this note might be different from the original.  HST 05/03/2021, AHI 8.0     Status post angioplasty with stent 02/01/2022   05/10/2021      Testicular hypofunction    Vitamin D deficiency     Current Medications: No outpatient medications have been marked as taking for the 10/30/23 encounter (Appointment) with Baldo Daub, MD.      EKGs/Labs/Other Studies Reviewed:    The following studies were reviewed today:  Cardiac Studies & Procedures   ______________________________________________________________________________________________ CARDIAC CATHETERIZATION  CARDIAC CATHETERIZATION 05/10/2021  Narrative   Culprit Lesion Segment: Prox RCA lesion is 65% stenosed.  Prox RCA to Mid RCA lesion is 80% stenosed.   A drug-eluting stent was successfully placed using a SYNERGY XD 2.50X48. ->  Postdilated 2.75 mm   Post intervention, there is a 0% residual stenosis throughout the stented segment.Marland Kitchen   ------------------------------------------------------------   Prox LAD to Mid LAD lesion is 20% stenosed with 40% stenosed side branch in 1st Diag.   Mid LAD lesion is 60% stenosed with 35% stenosed side branch in 2nd Diag. - (RFR 0.91, FFR 0.82 --> Borderline, consistent with CT FFR)   Dist Cx lesion is 40% stenosed with 40% stenosed side branch in 3rd Mrg.   ------------------------------------------------------------   The left ventricular systolic function is normal.  The left ventricular ejection fraction is 55-65% by visual estimate.  LV end diastolic pressure is normal   There is no aortic valve stenosis.  SUMMARY Two-vessel CAD Severe tandem lesions in the proximal to mid RCA (65% and 80%) lesions with documented positivity by CT FFR Successful DES PCI covering both  lesions (using a single Synergy DES 2.5 mm x 48 mm postdilated to 2.75 mm.) Moderate mid LAD stenosis (CT FFR estimated 0.82) -> RFR 0.91-0.92, FFR 0.82 showing consistent borderline/nonischemic findings.  (Treat medically) Minimal disease in the AV groove circumflex Relatively normal LVEF with normal EDP.   RECOMMENDATIONS Discharge to home after PACU  -plan same-day discharge-6 hours DAPT x6 months uninterrupted, then continue Plavix monotherapy for at least 2 total years given the extent of RCA stent and LAD disease Increase rosuvastatin 40 mg daily and continue other home medications -> currently on diltiazem as opposed to beta-blocker, will defer to primary cardiologist   Bryan Lemma, MD  Findings Coronary Findings Diagnostic  Dominance: Right  Left Main Vessel was injected. Vessel is normal in caliber. Vessel is angiographically normal.  Left Anterior Descending Prox LAD to Mid LAD lesion is 20% stenosed with 40% stenosed side branch in 1st Diag. The lesion is located at the bifurcation. Mid LAD lesion is 60% stenosed with 35% stenosed side branch in 2nd Diag. Vessel is not the culprit lesion. The lesion is located at the bend, focal and eccentric. At major branch Pressure wire/FFR was performed on the lesion. FFR: 0.82. Pressure X wire: RFR 0.91-0.92. RFR/FFR: XB LAD 3.5 guide catheter -&gt; pressure X wire; adenosine infused at 140 mcg/kg/min x2 min End Result-Borderline, Nonischemic -&gt; recommend medical management  Second Diagonal Branch Vessel is small in size.  Left Circumflex Vessel is large. There is mild diffuse disease throughout the vessel. Dist Cx lesion is 40% stenosed with 40% stenosed side branch in 3rd Mrg. The lesion is located at the bifurcation.  First Obtuse Marginal Branch Vessel is moderate in size.  Second Obtuse Marginal Branch Vessel is moderate in size The vessel exhibits minimal luminal irregularities.  Third Obtuse Marginal Branch Vessel is moderate in size Vessel is angiographically normal.  First Left Posterolateral Branch Vessel is moderate in size.  Second Left Posterolateral Branch Vessel is moderate in size.  Left Posterior Atrioventricular Artery Vessel is moderate in size.  Right Coronary Artery Vessel was injected. Vessel is normal in caliber. There is mild diffuse disease  throughout the vessel. There is severe focal disease in the vessel. Prox RCA lesion is 65% stenosed. The lesion is eccentric. Prox RCA to Mid RCA lesion is 80% stenosed. The lesion is segmental, eccentric and irregular.  Acute Marginal Branch Vessel is small in size.  Right Ventricular Branch Vessel is small in size.  Right Posterior Descending Artery Vessel is small in size.  First Right Posterolateral Branch Vessel is small in size.  Intervention  Prox RCA lesion Stent (Also treats lesions: Prox RCA to Mid RCA) Lesion length:  46 mm. CATH VISTA GUIDE 6FR JR4 guide catheter was inserted. Lesion crossed with guidewire using a WIRE ASAHI PROWATER 180CM. Pre-stent angioplasty was performed using a BALLN SAPPHIRE 2.25X15. Maximum pressure:  8 atm. Inflation time:  20 sec. 3 total inflations A drug-eluting stent was successfully placed using a SYNERGY XD 2.50X48. Maximum pressure: 14 atm. Inflation time: 30 sec. Stent strut is well apposed. Postdilated to 2.75 mm Post-stent angioplasty was performed. Maximum pressure:  18 atm. Inflation time:  20 sec. Stent balloon, high ATM Post-Intervention Lesion Assessment The intervention was successful. Pre-interventional TIMI flow is 3. Post-intervention TIMI flow is 3. Treated lesion length:  48 mm. There is a 0% residual stenosis post intervention.  Prox RCA to Mid RCA lesion Stent (Also treats lesions: Prox RCA) See details in Prox RCA lesion. Post-Intervention Lesion  Assessment The intervention was successful. Pre-interventional TIMI flow is 3. Post-intervention TIMI flow is 3. Treated lesion length:  48 mm. No complications occurred at this lesion. There is a 0% residual stenosis post intervention.     ECHOCARDIOGRAM  ECHOCARDIOGRAM COMPLETE 06/02/2021      CT SCANS  CT CORONARY FRACTIONAL FLOW RESERVE DATA PREP 05/03/2021  Narrative EXAM: FFRCT ANALYSIS  FINDINGS: FFRct analysis was performed on the original cardiac CT  angiogram dataset. Diagrammatic representation of the FFRct analysis is provided in a separate PDF document in PACS. This dictation was created using the PDF document and an interactive 3D model of the results. 3D model is not available in the EMR/PACS. Normal FFR range is >0.80.  1. LM: 0.99  2. LAD: Prox 0.94, mid (distal from the lesion) 0.82, distal 0.72 3. LCX: Prox 0.94, mid 0.85, distal 0.79 4. RCA: Prox 0.99, mid (distal from the lesion) 0.79, distal 0.76  IMPRESSION: Lesion seen in mid LAD is hemodynamically not significant.  Lesion noted in mid portion of RCA has borderline hemodynamic characteristic.  If pt is symptomatic consider cardiac catheterization.   Electronically Signed By: Gypsy Balsam M.D. On: 05/03/2021 18:01   CT SCANS  CT CORONARY MORPH W/CTA COR W/SCORE 04/30/2021  Addendum 04/30/2021  6:26 PM ADDENDUM REPORT: 04/30/2021 18:23  CLINICAL DATA:  Atypical chestpain  EXAM: Cardiac/Coronary  CTA  TECHNIQUE: The patient was scanned on a [employer].  FINDINGS: A 120 kV prospective scan was triggered in the descending thoracic aorta at 111 HU's. Axial non-contrast 3 mm slices were carried out through the heart. The data set was analyzed on a dedicated work station and scored using the Agatson method. Gantry rotation speed was 250 msecs and collimation was .6 mm. No beta blockade and 0.8 mg of sl NTG was given. The 3D data set was reconstructed in 5% intervals of the 67-82 % of the R-R cycle. Diastolic phases were analyzed on a dedicated work station using MPR, MIP and VRT modes. The patient received 80 cc of contrast.  Aorta: Mildly enlarged ascending aorta - 38 mm. No calcifications. No dissection.  Aortic Valve:  Trileaflet.  No calcifications.  Coronary Arteries:  Normal coronary origin.  Right dominance.  RCA is a large dominant artery that gives rise to PDA and PLA. There is soft moderate (50-70% stenosis), plaque in the  mid portion of RCA  Left main is a moderate size artery that gives rise to LAD, LCX arteries as well as to small intermediate branch.  LAD is a large vessel. There is a calcified plaque that extends into left main coronary artery. This plaque create moderate stenosis (50-70%) of the proximal portion of the LAD. In the mid portion of LAD small minimal (0-24% stenosis) non obstructive calcified plaque is noted. LAD gives rise to moderate size D1. D1 has mild calcified, non obstructive plaques. Small D2 is noted and is free of disease.  LCX is a non-dominant artery that gives rise to moderate size OM1 branch as well as moderate size OM2. There are soft, mild (25-49%) non obstructive plaques noted in mid portion of CX  Other findings:  Normal pulmonary vein drainage into the left atrium.  Normal left atrial appendage without a thrombus.  Normal size of the pulmonary artery.  IMPRESSION: 1. Coronary calcium score: Not performed.  2. Normal coronary origin with right dominance.  3. CAD-RADS 3. Moderate stenosis. Consider symptom-guided anti-ischemic pharmacotherapy as well as risk factor modification per guideline directed care.  Additional analysis with CT FFR will be submitted.   Electronically Signed By: Gypsy Balsam M.D. On: 04/30/2021 18:23  Narrative EXAM: OVER-READ INTERPRETATION  CT CHEST  The following report is an over-read performed by radiologist Dr. Jeronimo Greaves of [facility] on 04/30/2021. This over-read does not include interpretation of cardiac or coronary anatomy or pathology. The coronary CTA interpretation by the cardiologist is attached.  COMPARISON:  01/21/2021 CTA chest  FINDINGS: Vascular: Aortic atherosclerosis. No central pulmonary embolism, on this non-dedicated study.  Mediastinum/Nodes: No imaged thoracic adenopathy.  Lungs/Pleura: No pleural fluid. No lobar consolidation. Bilateral subpleural predominant pulmonary nodules  including up to 6 mm are similar back to 01/19/2021.  Upper Abdomen: Normal imaged portions of the liver, spleen, stomach.  Musculoskeletal: No acute osseous abnormality.  IMPRESSION: 1.  No acute findings in the imaged extracardiac chest. 2.  Aortic Atherosclerosis (ICD10-I70.0). 3. Bilateral pulmonary nodules are subpleural predominant and similar back for 3 months. Per Fleischner criteria, follow-up chest CT at 15-21 months is considered optional for low risk patients, but recommended for high risk patients. This recommendation follows the consensus statement: Guidelines for Management of Incidental Pulmonary Nodules Detected on CT Images:From the Fleischner Society 2017; published online before print (10.1148/radiol.[id number]).  Electronically Signed: By: Jeronimo Greaves M.D. On: 04/30/2021 13:25     ______________________________________________________________________________________________          Recent Labs: No results found for requested labs within last 365 days.  Recent Lipid Panel    Component Value Date/Time   CHOL 144 10/22/2021 0815   TRIG 211 (H) 10/22/2021 0815   HDL 36 (L) 10/22/2021 0815   CHOLHDL 4.0 10/22/2021 0815   LDLCALC 73 10/22/2021 0815    Physical Exam:    VS:  There were no vitals taken for this visit.    Wt Readings from Last 3 Encounters:  07/26/22 246 lb 12.8 oz (111.9 kg)  10/22/21 245 lb (111.1 kg)  06/08/21 243 lb (110.2 kg)     GEN: *** Well nourished, well developed in no acute distress HEENT: Normal NECK: No JVD; No carotid bruits LYMPHATICS: No lymphadenopathy CARDIAC: ***RRR, no murmurs, rubs, gallops RESPIRATORY:  Clear to auscultation without rales, wheezing or rhonchi  ABDOMEN: Soft, non-tender, non-distended MUSCULOSKELETAL:  No edema; No deformity  SKIN: Warm and dry NEUROLOGIC:  Alert and oriented x 3 PSYCHIATRIC:  Normal affect    Signed, Norman Herrlich, MD  10/28/2023 2:46 PM    [facility]

## 2023-10-30 ENCOUNTER — Encounter: Payer: Self-pay | Admitting: Cardiology

## 2023-10-30 ENCOUNTER — Ambulatory Visit: Attending: Cardiology | Admitting: Cardiology

## 2023-10-30 VITALS — BP 122/86 | HR 80 | Ht 69.0 in | Wt 248.6 lb

## 2023-10-30 DIAGNOSIS — I10 Essential (primary) hypertension: Secondary | ICD-10-CM | POA: Insufficient documentation

## 2023-10-30 DIAGNOSIS — J432 Centrilobular emphysema: Secondary | ICD-10-CM | POA: Insufficient documentation

## 2023-10-30 DIAGNOSIS — I25118 Atherosclerotic heart disease of native coronary artery with other forms of angina pectoris: Secondary | ICD-10-CM | POA: Diagnosis not present

## 2023-10-30 DIAGNOSIS — E782 Mixed hyperlipidemia: Secondary | ICD-10-CM | POA: Diagnosis present

## 2023-10-30 MED ORDER — DILTIAZEM HCL ER COATED BEADS 240 MG PO CP24: 240.0000 mg | ORAL_CAPSULE | Freq: Every day | ORAL | 3 refills | Status: DC

## 2023-10-30 MED ORDER — ROSUVASTATIN CALCIUM 20 MG PO TABS: 20.0000 mg | ORAL_TABLET | Freq: Every day | ORAL | 3 refills | Status: DC

## 2023-10-30 MED ORDER — NITROGLYCERIN 0.4 MG SL SUBL: 0.4000 mg | SUBLINGUAL_TABLET | SUBLINGUAL | 3 refills | Status: DC | PRN

## 2023-10-30 MED ORDER — CLOPIDOGREL BISULFATE 75 MG PO TABS: 75.0000 mg | ORAL_TABLET | Freq: Every day | ORAL | 3 refills | Status: DC

## 2023-10-30 NOTE — Patient Instructions (Signed)
Medication Instructions:  Your physician recommends that you continue on your current medications as directed. Please refer to the Current Medication list given to you today.  *If you need a refill on your cardiac medications before your next appointment, please call your pharmacy*   Lab Work: Your physician recommends that you return for lab work in:   Labs today: CMP, Lipids  If you have labs (blood work) drawn today and your tests are completely normal, you will receive your results only by: MyChart Message (if you have MyChart) OR A paper copy in the mail If you have any lab test that is abnormal or we need to change your treatment, we will call you to review the results.   Testing/Procedures: None   Follow-Up: At [facility], you and your health needs are our priority.  As part of our continuing mission to provide you with exceptional heart care, we have created designated Provider Care Teams.  These Care Teams include your primary Cardiologist (physician) and Advanced Practice Providers (APPs -  Physician Assistants and Nurse Practitioners) who all work together to provide you with the care you need, when you need it.  We recommend signing up for the patient portal called "MyChart".  Sign up information is provided on this After Visit Summary.  MyChart is used to connect with patients for Virtual Visits (Telemedicine).  Patients are able to view lab/test results, encounter notes, upcoming appointments, etc.  Non-urgent messages can be sent to your provider as well.   To learn more about what you can do with MyChart, go to [email or web address].    Your next appointment:   1 year(s)  Provider:   Brian Munley, MD    Other Instructions None  

## 2024-01-25 ENCOUNTER — Telehealth: Payer: Self-pay | Admitting: Cardiology

## 2024-01-25 MED ORDER — ROSUVASTATIN CALCIUM 20 MG PO TABS: 20.0000 mg | ORAL_TABLET | Freq: Every day | ORAL | 3 refills | Status: AC

## 2024-01-25 MED ORDER — CLOPIDOGREL BISULFATE 75 MG PO TABS: 75.0000 mg | ORAL_TABLET | Freq: Every day | ORAL | 3 refills | Status: AC

## 2024-01-25 MED ORDER — NITROGLYCERIN 0.4 MG SL SUBL: 0.4000 mg | SUBLINGUAL_TABLET | SUBLINGUAL | 10 refills | Status: AC | PRN

## 2024-01-25 MED ORDER — DILTIAZEM HCL ER COATED BEADS 240 MG PO CP24: 240.0000 mg | ORAL_CAPSULE | Freq: Every day | ORAL | 3 refills | Status: DC

## 2024-01-25 NOTE — Telephone Encounter (Signed)
 Pt's medications were sent to pt's pharmacy as requested. Confirmation received.

## 2024-01-25 NOTE — Telephone Encounter (Signed)
 Patient would like a refill on his medications that Dr. Sandee Crook has prescribed sent to the [facility] in [place]. CB # [phone]

## 2024-04-09 DIAGNOSIS — J449 Chronic obstructive pulmonary disease, unspecified: Secondary | ICD-10-CM | POA: Diagnosis not present

## 2024-04-09 DIAGNOSIS — F17210 Nicotine dependence, cigarettes, uncomplicated: Secondary | ICD-10-CM | POA: Diagnosis not present

## 2024-04-09 DIAGNOSIS — G4733 Obstructive sleep apnea (adult) (pediatric): Secondary | ICD-10-CM | POA: Diagnosis not present

## 2024-04-25 DIAGNOSIS — G4733 Obstructive sleep apnea (adult) (pediatric): Secondary | ICD-10-CM | POA: Diagnosis not present

## 2024-05-08 ENCOUNTER — Telehealth: Payer: Self-pay | Admitting: Cardiology

## 2024-05-08 NOTE — Telephone Encounter (Signed)
Full vm 

## 2024-05-08 NOTE — Telephone Encounter (Signed)
 I scheduled this patient to see Leroy Hart on Friday, he was requesting to see [facility] but he's out of the office. He's having some issues with weakness, swelling in his ankles/feet, no energy, and SOB. He has stents and he has redness from his chest running down his arm. CB # [phone]

## 2024-05-09 NOTE — Progress Notes (Signed)
 Cardiology Office Note   Date:  05/10/2024  ID:  [name] [place], DOB 02/19/1961, MRN [id number] PCP: Silvano Angeline FALCON, NP  [facility] Providers Cardiologist:  Redell Leiter, MD     History of Present Illness Leroy Hart is a 63 y.o. male with a past medical history of CAD, hypertension, emphysema, OSA, dyslipidemia, tobacco abuse, aortic valve sclerosis  06/02/2021 echo EF 50 to 55%, impaired relaxation, mild aortic valve sclerosis 05/10/2021 left heart cath DES x 1 2.50X48 to proximal and mid RCA   04/30/2021 coronary CT calcium  score not performed however FFR was hemodynamically significant  [name] initially established care with Dr. Leiter in July 2022, he had had a CT scan revealing coronary artery calcifications, was also bothered by shortness of breath and neck discomfort.  He underwent a coronary CT which could not produce a score however FFR was hemodynamically significant.  He ultimately underwent left heart cath on 05/10/2021 with DES x 1 covering proximal and mid RCA lesion.  In October 2022 he had echo revealing EF of 50 to 55%.  Most recently was evaluated by Dr. Leiter on 10/30/2023, stable from a cardiac perspective, no changes were made to medications or plan of care and he was advised he could follow-up in 1 year.  He presents today accompanied by his wife with concerns of shortness of breath, increased pedal edema as well as weight gain.  He is visibly volume overloaded, some DOE however he does not appear to be in any acute distress.  Comparing his weights, he is up 40 pounds since he was last evaluated in our office in March, he is not sure how much he has gained recently, he states the symptoms began approximately 3 weeks ago. He denies chest pain, palpitations, orthopnea, n, v, dizziness, syncope, or early satiety.     ROS: Review of Systems  Respiratory:  Positive for shortness of breath.   Cardiovascular:  Positive for leg swelling.   All other systems reviewed and are negative.    Studies Reviewed      Cardiac Studies & Procedures   ______________________________________________________________________________________________ CARDIAC CATHETERIZATION  CARDIAC CATHETERIZATION 05/10/2021  Conclusion   Culprit Lesion Segment: Prox RCA lesion is 65% stenosed.  Prox RCA to Mid RCA lesion is 80% stenosed.   A drug-eluting stent was successfully placed using a SYNERGY XD 2.50X48. ->  Postdilated 2.75 mm   Post intervention, there is a 0% residual stenosis throughout the stented segment.SABRA   ------------------------------------------------------------   Prox LAD to Mid LAD lesion is 20% stenosed with 40% stenosed side branch in 1st Diag.   Mid LAD lesion is 60% stenosed with 35% stenosed side branch in 2nd Diag. - (RFR 0.91, FFR 0.82 --> Borderline, consistent with CT FFR)   Dist Cx lesion is 40% stenosed with 40% stenosed side branch in 3rd Mrg.   ------------------------------------------------------------   The left ventricular systolic function is normal.  The left ventricular ejection fraction is 55-65% by visual estimate.  LV end diastolic pressure is normal   There is no aortic valve stenosis.  SUMMARY Two-vessel CAD Severe tandem lesions in the proximal to mid RCA (65% and 80%) lesions with documented positivity by CT FFR Successful DES PCI covering both lesions (using a single Synergy DES 2.5 mm x 48 mm postdilated to 2.75 mm.) Moderate mid LAD stenosis (CT FFR estimated 0.82) -> RFR 0.91-0.92, FFR 0.82 showing consistent borderline/nonischemic findings.  (Treat medically) Minimal disease in the AV groove circumflex Relatively normal LVEF  with normal EDP.   RECOMMENDATIONS Discharge to home after PACU -plan same-day discharge-6 hours DAPT x6 months uninterrupted, then continue Plavix  monotherapy for at least 2 total years given the extent of RCA stent and LAD disease Increase rosuvastatin  40 mg daily and  continue other home medications -> currently on diltiazem  as opposed to beta-blocker, will defer to primary cardiologist   Alm Clay, MD  Findings Coronary Findings Diagnostic  Dominance: Right  Left Main Vessel was injected. Vessel is normal in caliber. Vessel is angiographically normal.  Left Anterior Descending Prox LAD to Mid LAD lesion is 20% stenosed with 40% stenosed side branch in 1st Diag. The lesion is located at the bifurcation. Mid LAD lesion is 60% stenosed with 35% stenosed side branch in 2nd Diag. Vessel is not the culprit lesion. The lesion is located at the bend, focal and eccentric. At major branch Pressure wire/FFR was performed on the lesion. FFR: 0.82. Pressure X wire: RFR 0.91-0.92. RFR/FFR: XB LAD 3.5 guide catheter -> pressure X wire; adenosine  infused at 140 mcg/kg/min x2 min End Result-Borderline, Nonischemic -> recommend medical management  Second Diagonal Branch Vessel is small in size.  Left Circumflex Vessel is large. There is mild diffuse disease throughout the vessel. Dist Cx lesion is 40% stenosed with 40% stenosed side branch in 3rd Mrg. The lesion is located at the bifurcation.  First Obtuse Marginal Branch Vessel is moderate in size.  Second Obtuse Marginal Branch Vessel is moderate in size The vessel exhibits minimal luminal irregularities.  Third Obtuse Marginal Branch Vessel is moderate in size Vessel is angiographically normal.  First Left Posterolateral Branch Vessel is moderate in size.  Second Left Posterolateral Branch Vessel is moderate in size.  Left Posterior Atrioventricular Artery Vessel is moderate in size.  Right Coronary Artery Vessel was injected. Vessel is normal in caliber. There is mild diffuse disease throughout the vessel. There is severe focal disease in the vessel. Prox RCA lesion is 65% stenosed. The lesion is eccentric. Prox RCA to Mid RCA lesion is 80% stenosed. The lesion is segmental, eccentric and  irregular.  Acute Marginal Branch Vessel is small in size.  Right Ventricular Branch Vessel is small in size.  Right Posterior Descending Artery Vessel is small in size.  First Right Posterolateral Branch Vessel is small in size.  Intervention  Prox RCA lesion Stent (Also treats lesions: Prox RCA to Mid RCA) Lesion length:  46 mm. CATH VISTA GUIDE 6FR JR4 guide catheter was inserted. Lesion crossed with guidewire using a WIRE ASAHI PROWATER 180CM. Pre-stent angioplasty was performed using a BALLN SAPPHIRE 2.25X15. Maximum pressure:  8 atm. Inflation time:  20 sec. 3 total inflations A drug-eluting stent was successfully placed using a SYNERGY XD 2.50X48. Maximum pressure: 14 atm. Inflation time: 30 sec. Stent strut is well apposed. Postdilated to 2.75 mm Post-stent angioplasty was performed. Maximum pressure:  18 atm. Inflation time:  20 sec. Stent balloon, high ATM Post-Intervention Lesion Assessment The intervention was successful. Pre-interventional TIMI flow is 3. Post-intervention TIMI flow is 3. Treated lesion length:  48 mm. There is a 0% residual stenosis post intervention.  Prox RCA to Mid RCA lesion Stent (Also treats lesions: Prox RCA) See details in Prox RCA lesion. Post-Intervention Lesion Assessment The intervention was successful. Pre-interventional TIMI flow is 3. Post-intervention TIMI flow is 3. Treated lesion length:  48 mm. No complications occurred at this lesion. There is a 0% residual stenosis post intervention.     ECHOCARDIOGRAM  ECHOCARDIOGRAM COMPLETE 06/02/2021  CT SCANS  CT CORONARY FRACTIONAL FLOW RESERVE DATA PREP 05/03/2021  Narrative EXAM: FFRCT ANALYSIS  FINDINGS: FFRct analysis was performed on the original cardiac CT angiogram dataset. Diagrammatic representation of the FFRct analysis is provided in a separate PDF document in PACS. This dictation was created using the PDF document and an interactive 3D model of the results. 3D  model is not available in the EMR/PACS. Normal FFR range is >0.80.  1. LM: 0.99  2. LAD: Prox 0.94, mid (distal from the lesion) 0.82, distal 0.72 3. LCX: Prox 0.94, mid 0.85, distal 0.79 4. RCA: Prox 0.99, mid (distal from the lesion) 0.79, distal 0.76  IMPRESSION: Lesion seen in mid LAD is hemodynamically not significant.  Lesion noted in mid portion of RCA has borderline hemodynamic characteristic.  If pt is symptomatic consider cardiac catheterization.   Electronically Signed By: Lamar Fitch M.D. On: 05/03/2021 18:01   CT SCANS  CT CORONARY MORPH W/CTA COR W/SCORE 04/30/2021  Addendum 04/30/2021  6:26 PM ADDENDUM REPORT: 04/30/2021 18:23  CLINICAL DATA:  Atypical chestpain  EXAM: Cardiac/Coronary  CTA  TECHNIQUE: The patient was scanned on a [employer].  FINDINGS: A 120 kV prospective scan was triggered in the descending thoracic aorta at 111 HU's. Axial non-contrast 3 mm slices were carried out through the heart. The data set was analyzed on a dedicated work station and scored using the Agatson method. Gantry rotation speed was 250 msecs and collimation was .6 mm. No beta blockade and 0.8 mg of sl NTG was given. The 3D data set was reconstructed in 5% intervals of the 67-82 % of the R-R cycle. Diastolic phases were analyzed on a dedicated work station using MPR, MIP and VRT modes. The patient received 80 cc of contrast.  Aorta: Mildly enlarged ascending aorta - 38 mm. No calcifications. No dissection.  Aortic Valve:  Trileaflet.  No calcifications.  Coronary Arteries:  Normal coronary origin.  Right dominance.  RCA is a large dominant artery that gives rise to PDA and PLA. There is soft moderate (50-70% stenosis), plaque in the mid portion of RCA  Left main is a moderate size artery that gives rise to LAD, LCX arteries as well as to small intermediate branch.  LAD is a large vessel. There is a calcified plaque that extends into left  main coronary artery. This plaque create moderate stenosis (50-70%) of the proximal portion of the LAD. In the mid portion of LAD small minimal (0-24% stenosis) non obstructive calcified plaque is noted. LAD gives rise to moderate size D1. D1 has mild calcified, non obstructive plaques. Small D2 is noted and is free of disease.  LCX is a non-dominant artery that gives rise to moderate size OM1 branch as well as moderate size OM2. There are soft, mild (25-49%) non obstructive plaques noted in mid portion of CX  Other findings:  Normal pulmonary vein drainage into the left atrium.  Normal left atrial appendage without a thrombus.  Normal size of the pulmonary artery.  IMPRESSION: 1. Coronary calcium  score: Not performed.  2. Normal coronary origin with right dominance.  3. CAD-RADS 3. Moderate stenosis. Consider symptom-guided anti-ischemic pharmacotherapy as well as risk factor modification per guideline directed care. Additional analysis with CT FFR will be submitted.   Electronically Signed By: Lamar Fitch M.D. On: 04/30/2021 18:23  Narrative EXAM: OVER-READ INTERPRETATION  CT CHEST  The following report is an over-read performed by radiologist Dr. Rockey Kilts of [facility] on 04/30/2021. This over-read  does not include interpretation of cardiac or coronary anatomy or pathology. The coronary CTA interpretation by the cardiologist is attached.  COMPARISON:  01/21/2021 CTA chest  FINDINGS: Vascular: Aortic atherosclerosis. No central pulmonary embolism, on this non-dedicated study.  Mediastinum/Nodes: No imaged thoracic adenopathy.  Lungs/Pleura: No pleural fluid. No lobar consolidation. Bilateral subpleural predominant pulmonary nodules including up to 6 mm are similar back to 01/19/2021.  Upper Abdomen: Normal imaged portions of the liver, spleen, stomach.  Musculoskeletal: No acute osseous abnormality.  IMPRESSION: 1.  No acute findings  in the imaged extracardiac chest. 2.  Aortic Atherosclerosis (ICD10-I70.0). 3. Bilateral pulmonary nodules are subpleural predominant and similar back for 3 months. Per Fleischner criteria, follow-up chest CT at 15-21 months is considered optional for low risk patients, but recommended for high risk patients. This recommendation follows the consensus statement: Guidelines for Management of Incidental Pulmonary Nodules Detected on CT Images:From the Fleischner Society 2017; published online before print (10.1148/radiol.7982838340).  Electronically Signed: By: Rockey Kilts M.D. On: 04/30/2021 13:25     ______________________________________________________________________________________________      Risk Assessment/Calculations           Physical Exam VS:  BP 130/70   Pulse 80   Ht 5' 9 (1.753 m)   Wt 285 lb (129.3 kg)   SpO2 95%   BMI 42.09 kg/m        Wt Readings from Last 3 Encounters:  05/10/24 285 lb (129.3 kg)  10/30/23 248 lb 9.6 oz (112.8 kg)  07/26/22 246 lb 12.8 oz (111.9 kg)    GEN: no acute distress, but visibly volume overloaded NECK: unable to determine JVD secondary to body habitus; No carotid bruits CARDIAC: RRR, no murmurs, rubs, gallops RESPIRATORY: Trace Rales appreciated in the bases of his lungs ABDOMEN: Abdomen is distended EXTREMITIES: +3 pitting edema mid tibia; no deformity   ASSESSMENT AND PLAN HFpEF-NYHA class III, he is clearly volume overloaded and we will apply Furoscix  in the office today.  His weight today is 285, which is up almost 40 pounds since his last office visit 6 months ago.  Will check proBNP, c-Met.  I will send him home with a sample of Furoscix  as well. Start potassium chloride  20 mEq and take two today. Echo.  CAD -  left heart cath DES x 1 2.50X48 to proximal and mid RCA. Stable with no anginal symptoms. No indication for ischemic evaluation.  Continue Plavix  75 mg daily, metoprolol  100 mg daily, nitroglycerin  as needed,  Crestor  20 mg daily.  Dyslipidemia-will prefer his LDL to be 55 or less, not addressed at this visit but will discuss this is at his next visit in 2 weeks.  Hypertension-continue HCTZ 12.5 mg daily, losartan 100 mg daily, metoprolol  100 mg daily  Tobacco abuse - deferred to next visit in 2 weeks.          Dispo: Furoscix  SQ to be determined after lab results at home, CBC, CMET, proBNP, potassium chloride  20 mEq po x 2 today then as determined after lab work. Return Monday for repeat BMET, nurse visit for weight. Follow up with me in 2 weeks.    Signed, Delon JAYSON Hoover, NP

## 2024-05-09 NOTE — Telephone Encounter (Signed)
Attempted to call the patient. Patient did not answer the phone and his voice mail was full so unable to leave a message.

## 2024-05-10 ENCOUNTER — Encounter: Payer: Self-pay | Admitting: Cardiology

## 2024-05-10 ENCOUNTER — Ambulatory Visit: Payer: Medicare (Managed Care) | Attending: Cardiology | Admitting: Cardiology

## 2024-05-10 VITALS — BP 130/70 | HR 80 | Ht 69.0 in | Wt 285.0 lb

## 2024-05-10 DIAGNOSIS — I10 Essential (primary) hypertension: Secondary | ICD-10-CM

## 2024-05-10 DIAGNOSIS — J432 Centrilobular emphysema: Secondary | ICD-10-CM | POA: Diagnosis not present

## 2024-05-10 DIAGNOSIS — E782 Mixed hyperlipidemia: Secondary | ICD-10-CM

## 2024-05-10 DIAGNOSIS — R0600 Dyspnea, unspecified: Secondary | ICD-10-CM

## 2024-05-10 DIAGNOSIS — I5033 Acute on chronic diastolic (congestive) heart failure: Secondary | ICD-10-CM

## 2024-05-10 DIAGNOSIS — I25118 Atherosclerotic heart disease of native coronary artery with other forms of angina pectoris: Secondary | ICD-10-CM

## 2024-05-10 DIAGNOSIS — Z79899 Other long term (current) drug therapy: Secondary | ICD-10-CM

## 2024-05-10 MED ORDER — POTASSIUM CHLORIDE CRYS ER 20 MEQ PO TBCR: 20.0000 meq | EXTENDED_RELEASE_TABLET | Freq: Every day | ORAL | 3 refills | Status: DC

## 2024-05-10 MED ORDER — FUROSCIX 80 MG/10ML ~~LOC~~ CTKT: 80.0000 mg | CARTRIDGE | Freq: Once | SUBCUTANEOUS | Status: DC

## 2024-05-10 NOTE — Patient Instructions (Addendum)
 Medication Instructions:  Your physician recommends that you continue on your current medications as directed. Please refer to the Current Medication list given to you today.  *If you need a refill on your cardiac medications before your next appointment, please call your pharmacy*  Lab Work: Your physician recommends that you return for lab work in: Today for CMP, CBC and ProBNP Come back on Monday 9/22 for BMP  If you have labs (blood work) drawn today and your tests are completely normal, you will receive your results only by: MyChart Message (if you have MyChart) OR A paper copy in the mail If you have any lab test that is abnormal or we need to change your treatment, we will call you to review the results.  Testing/Procedures: Your physician has requested that you have an echocardiogram. Echocardiography is a painless test that uses sound waves to create images of your heart. It provides your doctor with information about the size and shape of your heart and how well your heart's chambers and valves are working. This procedure takes approximately one hour. There are no restrictions for this procedure. Please do NOT wear cologne, perfume, aftershave, or lotions (deodorant is allowed). Please arrive 15 minutes prior to your appointment time.  Please note: We ask at that you not bring children with you during ultrasound (echo/ vascular) testing. Due to room size and safety concerns, children are not allowed in the ultrasound rooms during exams. Our front office staff cannot provide observation of children in our lobby area while testing is being conducted. An adult accompanying a patient to their appointment will only be allowed in the ultrasound room at the discretion of the ultrasound technician under special circumstances. We apologize for any inconvenience.   Follow-Up: At [facility], you and your health needs are our priority.  As part of our continuing mission to provide you  with exceptional heart care, our providers are all part of one team.  This team includes your primary Cardiologist (physician) and Advanced Practice Providers or APPs (Physician Assistants and Nurse Practitioners) who all work together to provide you with the care you need, when you need it.  Your next appointment:   2 week(s)  Provider:   Delon Hoover, NP [place])    We recommend signing up for the patient portal called MyChart.  Sign up information is provided on this After Visit Summary.  MyChart is used to connect with patients for Virtual Visits (Telemedicine).  Patients are able to view lab/test results, encounter notes, upcoming appointments, etc.  Non-urgent messages can be sent to your provider as well.   To learn more about what you can do with MyChart, go to [email or web address].   Other Instructions Delon will call you on Saturday to let you know if you need to do the other Furoscix 

## 2024-05-13 ENCOUNTER — Ambulatory Visit: Payer: Medicare (Managed Care) | Attending: Cardiology

## 2024-05-13 ENCOUNTER — Ambulatory Visit: Payer: Self-pay | Admitting: Cardiology

## 2024-05-13 DIAGNOSIS — Z79899 Other long term (current) drug therapy: Secondary | ICD-10-CM | POA: Diagnosis not present

## 2024-05-13 LAB — CBC WITH DIFFERENTIAL/PLATELET
Basophils Absolute: 0.1 x10E3/uL (ref 0.0–0.2)
Basos: 1 %
EOS (ABSOLUTE): 0.4 x10E3/uL (ref 0.0–0.4)
Eos: 4 %
Hematocrit: 47.2 % (ref 37.5–51.0)
Hemoglobin: 15.1 g/dL (ref 13.0–17.7)
Immature Grans (Abs): 0.1 x10E3/uL (ref 0.0–0.1)
Immature Granulocytes: 1 %
Lymphocytes Absolute: 2.3 x10E3/uL (ref 0.7–3.1)
Lymphs: 24 %
MCH: 28.7 pg (ref 26.6–33.0)
MCHC: 32 g/dL (ref 31.5–35.7)
MCV: 90 fL (ref 79–97)
Monocytes Absolute: 0.7 x10E3/uL (ref 0.1–0.9)
Monocytes: 7 %
Neutrophils Absolute: 6.2 x10E3/uL (ref 1.4–7.0)
Neutrophils: 63 %
Platelets: 111 x10E3/uL — ABNORMAL LOW (ref 150–450)
RBC: 5.27 x10E6/uL (ref 4.14–5.80)
RDW: 15.7 % — ABNORMAL HIGH (ref 11.6–15.4)
WBC: 9.7 x10E3/uL (ref 3.4–10.8)

## 2024-05-13 LAB — COMPREHENSIVE METABOLIC PANEL WITH GFR
ALT: 29 IU/L (ref 0–44)
AST: 20 IU/L (ref 0–40)
Albumin: 4.3 g/dL (ref 3.8–4.9)
Alkaline Phosphatase: 103 IU/L (ref 47–123)
BUN/Creatinine Ratio: 20 (ref 10–24)
BUN: 21 mg/dL (ref 8–27)
Bilirubin Total: <0.2 mg/dL (ref 0.0–1.2)
CO2: 23 mmol/L (ref 20–29)
Calcium: 10.9 mg/dL — ABNORMAL HIGH (ref 8.6–10.2)
Chloride: 104 mmol/L (ref 96–106)
Creatinine, Ser: 1.05 mg/dL (ref 0.76–1.27)
Globulin, Total: 2.7 g/dL (ref 1.5–4.5)
Glucose: 100 mg/dL — ABNORMAL HIGH (ref 70–99)
Potassium: 4.2 mmol/L (ref 3.5–5.2)
Sodium: 142 mmol/L (ref 134–144)
Total Protein: 7 g/dL (ref 6.0–8.5)
eGFR: 81 mL/min/1.73

## 2024-05-13 LAB — PRO B NATRIURETIC PEPTIDE: NT-Pro BNP: <36 pg/mL (ref 0–210)

## 2024-05-13 NOTE — Progress Notes (Signed)
   Nurse Visit   Date of Encounter: 05/13/2024 ID: Leroy Hart, DOB [birth year], MRN [id number]  PCP:  Silvano Angeline FALCON, NP   [facility] Providers Cardiologist:  Redell Leiter, MD      Visit Details   VS:  There were no vitals taken for this visit. , BMI There is no height or weight on file to calculate BMI.  Wt Readings from Last 3 Encounters:  05/10/24 285 lb (129.3 kg)  10/30/23 248 lb 9.6 oz (112.8 kg)  07/26/22 246 lb 12.8 oz (111.9 kg)     Reason for visit: Weight check and BMP per Delon Hoover, NP Performed today: Vitals, BMP, consulted Delon Hoover, NP Changes (medications, testing, etc.) : None Length of Visit: 15 minutes    Medications Adjustments/Labs and Tests Ordered: No orders of the defined types were placed in this encounter.  No orders of the defined types were placed in this encounter.    Signed, Olam JONETTA Lesches, RN  05/13/2024 9:04 AM

## 2024-05-14 ENCOUNTER — Telehealth: Payer: Self-pay

## 2024-05-14 ENCOUNTER — Other Ambulatory Visit: Payer: Self-pay

## 2024-05-14 LAB — BASIC METABOLIC PANEL WITH GFR
BUN/Creatinine Ratio: 20 (ref 10–24)
BUN: 25 mg/dL (ref 8–27)
CO2: 23 mmol/L (ref 20–29)
Calcium: 10.9 mg/dL — ABNORMAL HIGH (ref 8.6–10.2)
Chloride: 100 mmol/L (ref 96–106)
Creatinine, Ser: 1.28 mg/dL — ABNORMAL HIGH (ref 0.76–1.27)
Glucose: 157 mg/dL — ABNORMAL HIGH (ref 70–99)
Potassium: 4.1 mmol/L (ref 3.5–5.2)
Sodium: 141 mmol/L (ref 134–144)
eGFR: 64 mL/min/1.73

## 2024-05-14 MED ORDER — FUROSEMIDE 20 MG PO TABS: 20.0000 mg | ORAL_TABLET | Freq: Every day | ORAL | 3 refills | Status: DC

## 2024-05-14 NOTE — Telephone Encounter (Signed)
 Lasix  20mg  q d #90 sent to [facility] per Delon Hoover, NP

## 2024-05-21 NOTE — Progress Notes (Addendum)
 " Cardiology Office Note   Date:  05/24/2024  ID:  OTHAR CURTO, DOB 05-03-1961, MRN [id number] PCP: Silvano Angeline FALCON, NP  [facility] Providers Cardiologist:  Redell Leiter, MD     History of Present Illness DEQUINCY BORN is a 63 y.o. male with a past medical history of CAD, hypertension, emphysema--follows with Dr. Mardee, OSA, dyslipidemia, tobacco abuse, COPD follows with [facility], aortic valve sclerosis.  06/02/2021 echo EF 50 to 55%, impaired relaxation, mild aortic valve sclerosis 05/10/2021 left heart cath DES x 1 2.50X48 to proximal and mid RCA   04/30/2021 coronary CT calcium  score not performed however FFR was hemodynamically significant  Mr. Sankey initially established care with Dr. Leiter in July 2022, he had had a CT scan revealing coronary artery calcifications, was also bothered by shortness of breath and neck discomfort.  He underwent a coronary CT which could not produce a score however FFR was hemodynamically significant.  He ultimately underwent left heart cath on 05/10/2021 with DES x 1 covering proximal and mid RCA lesion.  In October 2022 he had echo revealing EF of 50 to 55%.  Most recently was evaluated by Dr. Leiter on 10/30/2023, stable from a cardiac perspective, no changes were made to medications or plan of care and he was advised he could follow-up in 1 year.  Most recently he was evaluated by myself on 05/10/2024 with concerns of shortness of breath, increased weight gain, pedal edema--visibly volume overloaded, we applied Furoscix  in the office as well as at home, arrange for repeat echocardiogram and advised to follow-up in 2 weeks.  He presents today accompanied by his wife for follow-up.  He did have some improvement in his symptoms after Furoscix , his legs appear to be less edematous although his right leg is still slightly edematous.  He has been following closely with Dr. Mardee, had an in-house sleep evaluation this week  and is waiting for recommendations.  Previously thought we were dealing with a heart failure exacerbation but it appears to be COPD that is not optimally controlled, he mentions that he possibly needs to be on oxygen as well. He denies chest pain, palpitations, pnd, orthopnea, n, v, dizziness, syncope,weight gain, or early satiety.     ROS: Review of Systems  Respiratory:  Positive for shortness of breath (baseline).   Cardiovascular:  Positive for leg swelling (improved).  All other systems reviewed and are negative.    Studies Reviewed      Cardiac Studies & Procedures   ______________________________________________________________________________________________ CARDIAC CATHETERIZATION  CARDIAC CATHETERIZATION 05/10/2021  Conclusion   Culprit Lesion Segment: Prox RCA lesion is 65% stenosed.  Prox RCA to Mid RCA lesion is 80% stenosed.   A drug-eluting stent was successfully placed using a SYNERGY XD 2.50X48. ->  Postdilated 2.75 mm   Post intervention, there is a 0% residual stenosis throughout the stented segment.SABRA   ------------------------------------------------------------   Prox LAD to Mid LAD lesion is 20% stenosed with 40% stenosed side branch in 1st Diag.   Mid LAD lesion is 60% stenosed with 35% stenosed side branch in 2nd Diag. - (RFR 0.91, FFR 0.82 --> Borderline, consistent with CT FFR)   Dist Cx lesion is 40% stenosed with 40% stenosed side branch in 3rd Mrg.   ------------------------------------------------------------   The left ventricular systolic function is normal.  The left ventricular ejection fraction is 55-65% by visual estimate.  LV end diastolic pressure is normal   There is no aortic valve stenosis.  SUMMARY Two-vessel  CAD Severe tandem lesions in the proximal to mid RCA (65% and 80%) lesions with documented positivity by CT FFR Successful DES PCI covering both lesions (using a single Synergy DES 2.5 mm x 48 mm postdilated to 2.75 mm.) Moderate mid LAD  stenosis (CT FFR estimated 0.82) -> RFR 0.91-0.92, FFR 0.82 showing consistent borderline/nonischemic findings.  (Treat medically) Minimal disease in the AV groove circumflex Relatively normal LVEF with normal EDP.   RECOMMENDATIONS Discharge to home after PACU -plan same-day discharge-6 hours DAPT x6 months uninterrupted, then continue Plavix  monotherapy for at least 2 total years given the extent of RCA stent and LAD disease Increase rosuvastatin  40 mg daily and continue other home medications -> currently on diltiazem  as opposed to beta-blocker, will defer to primary cardiologist   Alm Clay, MD  Findings Coronary Findings Diagnostic  Dominance: Right  Left Main Vessel was injected. Vessel is normal in caliber. Vessel is angiographically normal.  Left Anterior Descending Prox LAD to Mid LAD lesion is 20% stenosed with 40% stenosed side branch in 1st Diag. The lesion is located at the bifurcation. Mid LAD lesion is 60% stenosed with 35% stenosed side branch in 2nd Diag. Vessel is not the culprit lesion. The lesion is located at the bend, focal and eccentric. At major branch Pressure wire/FFR was performed on the lesion. FFR: 0.82. Pressure X wire: RFR 0.91-0.92. RFR/FFR: XB LAD 3.5 guide catheter -> pressure X wire; adenosine  infused at 140 mcg/kg/min x2 min End Result-Borderline, Nonischemic -> recommend medical management  Second Diagonal Branch Vessel is small in size.  Left Circumflex Vessel is large. There is mild diffuse disease throughout the vessel. Dist Cx lesion is 40% stenosed with 40% stenosed side branch in 3rd Mrg. The lesion is located at the bifurcation.  First Obtuse Marginal Branch Vessel is moderate in size.  Second Obtuse Marginal Branch Vessel is moderate in size The vessel exhibits minimal luminal irregularities.  Third Obtuse Marginal Branch Vessel is moderate in size Vessel is angiographically normal.  First Left Posterolateral Branch Vessel is  moderate in size.  Second Left Posterolateral Branch Vessel is moderate in size.  Left Posterior Atrioventricular Artery Vessel is moderate in size.  Right Coronary Artery Vessel was injected. Vessel is normal in caliber. There is mild diffuse disease throughout the vessel. There is severe focal disease in the vessel. Prox RCA lesion is 65% stenosed. The lesion is eccentric. Prox RCA to Mid RCA lesion is 80% stenosed. The lesion is segmental, eccentric and irregular.  Acute Marginal Branch Vessel is small in size.  Right Ventricular Branch Vessel is small in size.  Right Posterior Descending Artery Vessel is small in size.  First Right Posterolateral Branch Vessel is small in size.  Intervention  Prox RCA lesion Stent (Also treats lesions: Prox RCA to Mid RCA) Lesion length:  46 mm. CATH VISTA GUIDE 6FR JR4 guide catheter was inserted. Lesion crossed with guidewire using a WIRE ASAHI PROWATER 180CM. Pre-stent angioplasty was performed using a BALLN SAPPHIRE 2.25X15. Maximum pressure:  8 atm. Inflation time:  20 sec. 3 total inflations A drug-eluting stent was successfully placed using a SYNERGY XD 2.50X48. Maximum pressure: 14 atm. Inflation time: 30 sec. Stent strut is well apposed. Postdilated to 2.75 mm Post-stent angioplasty was performed. Maximum pressure:  18 atm. Inflation time:  20 sec. Stent balloon, high ATM Post-Intervention Lesion Assessment The intervention was successful. Pre-interventional TIMI flow is 3. Post-intervention TIMI flow is 3. Treated lesion length:  48 mm. There is a 0%  residual stenosis post intervention.  Prox RCA to Mid RCA lesion Stent (Also treats lesions: Prox RCA) See details in Prox RCA lesion. Post-Intervention Lesion Assessment The intervention was successful. Pre-interventional TIMI flow is 3. Post-intervention TIMI flow is 3. Treated lesion length:  48 mm. No complications occurred at this lesion. There is a 0% residual stenosis post  intervention.     ECHOCARDIOGRAM  ECHOCARDIOGRAM COMPLETE 06/02/2021      CT SCANS  CT CORONARY FRACTIONAL FLOW RESERVE DATA PREP 05/03/2021  Narrative EXAM: FFRCT ANALYSIS  FINDINGS: FFRct analysis was performed on the original cardiac CT angiogram dataset. Diagrammatic representation of the FFRct analysis is provided in a separate PDF document in PACS. This dictation was created using the PDF document and an interactive 3D model of the results. 3D model is not available in the EMR/PACS. Normal FFR range is >0.80.  1. LM: 0.99  2. LAD: Prox 0.94, mid (distal from the lesion) 0.82, distal 0.72 3. LCX: Prox 0.94, mid 0.85, distal 0.79 4. RCA: Prox 0.99, mid (distal from the lesion) 0.79, distal 0.76  IMPRESSION: Lesion seen in mid LAD is hemodynamically not significant.  Lesion noted in mid portion of RCA has borderline hemodynamic characteristic.  If pt is symptomatic consider cardiac catheterization.   Electronically Signed By: Lamar Fitch M.D. On: 05/03/2021 18:01   CT SCANS  CT CORONARY MORPH W/CTA COR W/SCORE 04/30/2021  Addendum 04/30/2021  6:26 PM ADDENDUM REPORT: 04/30/2021 18:23  CLINICAL DATA:  Atypical chestpain  EXAM: Cardiac/Coronary  CTA  TECHNIQUE: The patient was scanned on a [employer].  FINDINGS: A 120 kV prospective scan was triggered in the descending thoracic aorta at 111 HU's. Axial non-contrast 3 mm slices were carried out through the heart. The data set was analyzed on a dedicated work station and scored using the Agatson method. Gantry rotation speed was 250 msecs and collimation was .6 mm. No beta blockade and 0.8 mg of sl NTG was given. The 3D data set was reconstructed in 5% intervals of the 67-82 % of the R-R cycle. Diastolic phases were analyzed on a dedicated work station using MPR, MIP and VRT modes. The patient received 80 cc of contrast.  Aorta: Mildly enlarged ascending aorta - 38 mm. No  calcifications. No dissection.  Aortic Valve:  Trileaflet.  No calcifications.  Coronary Arteries:  Normal coronary origin.  Right dominance.  RCA is a large dominant artery that gives rise to PDA and PLA. There is soft moderate (50-70% stenosis), plaque in the mid portion of RCA  Left main is a moderate size artery that gives rise to LAD, LCX arteries as well as to small intermediate branch.  LAD is a large vessel. There is a calcified plaque that extends into left main coronary artery. This plaque create moderate stenosis (50-70%) of the proximal portion of the LAD. In the mid portion of LAD small minimal (0-24% stenosis) non obstructive calcified plaque is noted. LAD gives rise to moderate size D1. D1 has mild calcified, non obstructive plaques. Small D2 is noted and is free of disease.  LCX is a non-dominant artery that gives rise to moderate size OM1 branch as well as moderate size OM2. There are soft, mild (25-49%) non obstructive plaques noted in mid portion of CX  Other findings:  Normal pulmonary vein drainage into the left atrium.  Normal left atrial appendage without a thrombus.  Normal size of the pulmonary artery.  IMPRESSION: 1. Coronary calcium  score: Not performed.  2. Normal  coronary origin with right dominance.  3. CAD-RADS 3. Moderate stenosis. Consider symptom-guided anti-ischemic pharmacotherapy as well as risk factor modification per guideline directed care. Additional analysis with CT FFR will be submitted.   Electronically Signed By: Lamar Fitch M.D. On: 04/30/2021 18:23  Narrative EXAM: OVER-READ INTERPRETATION  CT CHEST  The following report is an over-read performed by radiologist Dr. Rockey Kilts of [facility] on 04/30/2021. This over-read does not include interpretation of cardiac or coronary anatomy or pathology. The coronary CTA interpretation by the cardiologist is attached.  COMPARISON:  01/21/2021 CTA  chest  FINDINGS: Vascular: Aortic atherosclerosis. No central pulmonary embolism, on this non-dedicated study.  Mediastinum/Nodes: No imaged thoracic adenopathy.  Lungs/Pleura: No pleural fluid. No lobar consolidation. Bilateral subpleural predominant pulmonary nodules including up to 6 mm are similar back to 01/19/2021.  Upper Abdomen: Normal imaged portions of the liver, spleen, stomach.  Musculoskeletal: No acute osseous abnormality.  IMPRESSION: 1.  No acute findings in the imaged extracardiac chest. 2.  Aortic Atherosclerosis (ICD10-I70.0). 3. Bilateral pulmonary nodules are subpleural predominant and similar back for 3 months. Per Fleischner criteria, follow-up chest CT at 15-21 months is considered optional for low risk patients, but recommended for high risk patients. This recommendation follows the consensus statement: Guidelines for Management of Incidental Pulmonary Nodules Detected on CT Images:From the Fleischner Society 2017; published online before print (10.1148/radiol.7982838340).  Electronically Signed: By: Rockey Kilts M.D. On: 04/30/2021 13:25     ______________________________________________________________________________________________      Risk Assessment/Calculations           Physical Exam VS:  BP (!) 112/58   Pulse 98   Ht 5' 9 (1.753 m)   Wt 284 lb 12.8 oz (129.2 kg)   SpO2 98%   BMI 42.06 kg/m        Wt Readings from Last 3 Encounters:  05/24/24 284 lb 12.8 oz (129.2 kg)  05/13/24 283 lb 6.4 oz (128.5 kg)  05/10/24 285 lb (129.3 kg)    GEN: no acute distress NECK: No carotid bruits CARDIAC: RRR, no murmurs, rubs, gallops RESPIRATORY: clear ABDOMEN: Abdomen is distended EXTREMITIES: much improved since last OV, none in L, +1 in right ; no deformity   ASSESSMENT AND PLAN CAD -  left heart cath DES x 1 2.50X48 to proximal and mid RCA. Stable with no anginal symptoms. No indication for ischemic evaluation.  Continue Plavix  75  mg daily, metoprolol  100 mg daily, nitroglycerin  as needed, Crestor  20 mg daily. Start Wegovy , he has known CAD with multiple co-morbid conditions, to help prevent major adverse coronary events in the future.   Pedal edema/shortness of breath-proBNP was normal at last office however he is obese so there could be a component of heart failure, we did give him Furoscix  in the office last week he does feel somewhat better however his breathing still labored and likely this is related to his COPD that is not well-controlled.  Continue Lasix  20 mg daily, his pedal edema is better.  Will repeat BMET.  Awaiting upcoming echo.  COPD/OSA - both followed by Dr. Mardee, recent OSA in house evaluation this week and awaiting recommendations from his office.   Dyslipidemia-will prefer his LDL to be 55 or less, not addressed at this visit, defer to next visit if not addressed by PCP.   Hypertension-continue HCTZ 12.5 mg daily, losartan 100 mg daily, metoprolol  100 mg daily  Tobacco abuse -he voices readiness to stop, we will start him on Chantix  starter pack.  Obesity-BMI is greater than 42, he obviously needs to lose weight and I would like to see him started on a GLP-1 with indication either for his OSA or for his coronary artery disease.        Dispo: Start Wegovy , Repeat BMET, start Chantix , follow up in 3 months.     Signed, Delon JAYSON Hoover, NP  "

## 2024-05-22 DIAGNOSIS — J449 Chronic obstructive pulmonary disease, unspecified: Secondary | ICD-10-CM | POA: Diagnosis not present

## 2024-05-22 DIAGNOSIS — G4733 Obstructive sleep apnea (adult) (pediatric): Secondary | ICD-10-CM | POA: Diagnosis not present

## 2024-05-22 DIAGNOSIS — F17210 Nicotine dependence, cigarettes, uncomplicated: Secondary | ICD-10-CM | POA: Diagnosis not present

## 2024-05-23 ENCOUNTER — Other Ambulatory Visit: Payer: Self-pay

## 2024-05-24 ENCOUNTER — Other Ambulatory Visit (HOSPITAL_COMMUNITY): Payer: Self-pay

## 2024-05-24 ENCOUNTER — Encounter: Payer: Self-pay | Admitting: Cardiology

## 2024-05-24 ENCOUNTER — Telehealth: Payer: Self-pay | Admitting: Pharmacy Technician

## 2024-05-24 ENCOUNTER — Ambulatory Visit: Payer: Medicare (Managed Care) | Attending: Cardiology | Admitting: Cardiology

## 2024-05-24 ENCOUNTER — Telehealth: Payer: Self-pay

## 2024-05-24 VITALS — BP 112/58 | HR 98 | Ht 69.0 in | Wt 284.8 lb

## 2024-05-24 DIAGNOSIS — I25118 Atherosclerotic heart disease of native coronary artery with other forms of angina pectoris: Secondary | ICD-10-CM | POA: Diagnosis not present

## 2024-05-24 DIAGNOSIS — E782 Mixed hyperlipidemia: Secondary | ICD-10-CM | POA: Diagnosis not present

## 2024-05-24 DIAGNOSIS — E66813 Obesity, class 3: Secondary | ICD-10-CM

## 2024-05-24 DIAGNOSIS — Z720 Tobacco use: Secondary | ICD-10-CM

## 2024-05-24 DIAGNOSIS — Z6841 Body Mass Index (BMI) 40.0 and over, adult: Secondary | ICD-10-CM

## 2024-05-24 DIAGNOSIS — J432 Centrilobular emphysema: Secondary | ICD-10-CM | POA: Diagnosis not present

## 2024-05-24 DIAGNOSIS — I10 Essential (primary) hypertension: Secondary | ICD-10-CM | POA: Diagnosis not present

## 2024-05-24 DIAGNOSIS — I5030 Unspecified diastolic (congestive) heart failure: Secondary | ICD-10-CM

## 2024-05-24 DIAGNOSIS — R600 Localized edema: Secondary | ICD-10-CM

## 2024-05-24 MED ORDER — SEMAGLUTIDE-WEIGHT MANAGEMENT 1.7 MG/0.75ML ~~LOC~~ SOAJ: 1.7000 mg | SUBCUTANEOUS | 0 refills | Status: DC

## 2024-05-24 MED ORDER — SEMAGLUTIDE-WEIGHT MANAGEMENT 0.25 MG/0.5ML ~~LOC~~ SOAJ: 0.2500 mg | SUBCUTANEOUS | 0 refills | Status: AC

## 2024-05-24 MED ORDER — SEMAGLUTIDE-WEIGHT MANAGEMENT 0.5 MG/0.5ML ~~LOC~~ SOAJ: 0.5000 mg | SUBCUTANEOUS | 0 refills | Status: DC

## 2024-05-24 MED ORDER — SEMAGLUTIDE-WEIGHT MANAGEMENT 1 MG/0.5ML ~~LOC~~ SOAJ: 1.0000 mg | SUBCUTANEOUS | 0 refills | Status: DC

## 2024-05-24 MED ORDER — VARENICLINE TARTRATE (STARTER) 0.5 MG X 11 & 1 MG X 42 PO TBPK: 1.0000 | ORAL_TABLET | ORAL | 0 refills | Status: AC

## 2024-05-24 MED ORDER — SEMAGLUTIDE-WEIGHT MANAGEMENT 2.4 MG/0.75ML ~~LOC~~ SOAJ: 2.4000 mg | SUBCUTANEOUS | 0 refills | Status: DC

## 2024-05-24 NOTE — Addendum Note (Signed)
 Addended by: CARLIN DELON BROCKS on: 05/24/2024 01:23 PM   Modules accepted: Orders

## 2024-05-24 NOTE — Patient Instructions (Addendum)
 Medication Instructions:  Your physician has recommended you make the following change in your medication:   START: Chantix starter pack (Take as directed)  *If you need a refill on your cardiac medications before your next appointment, please call your pharmacy*  Lab Work: Your physician recommends that you return for lab work in:   Labs today: BMP  If you have labs (blood work) drawn today and your tests are completely normal, you will receive your results only by: MyChart Message (if you have MyChart) OR A paper copy in the mail If you have any lab test that is abnormal or we need to change your treatment, we will call you to review the results.  Testing/Procedures: None  Follow-Up: At [facility], you and your health needs are our priority.  As part of our continuing mission to provide you with exceptional heart care, our providers are all part of one team.  This team includes your primary Cardiologist (physician) and Advanced Practice Providers or APPs (Physician Assistants and Nurse Practitioners) who all work together to provide you with the care you need, when you need it.  Your next appointment:   3 month(s)  Provider:   Redell Leiter, MD  We recommend signing up for the patient portal called MyChart.  Sign up information is provided on this After Visit Summary.  MyChart is used to connect with patients for Virtual Visits (Telemedicine).  Patients are able to view lab/test results, encounter notes, upcoming appointments, etc.  Non-urgent messages can be sent to your provider as well.   To learn more about what you can do with MyChart, go to [email or web address].   Other Instructions Call your insurance company and check if they cover [facility] and/or [facility]

## 2024-05-24 NOTE — Telephone Encounter (Signed)
 Pharmacy Patient Advocate Encounter   Received notification from CoverMyMeds that prior authorization for [facility] 0.25MG  is required/requested.   Insurance verification completed.   The patient is insured through [employer].   Per test claim: PA required; PA submitted to above mentioned insurance via Latent Key/confirmation #/EOC [id number] Status is pending     No mi/stroke/pad history found

## 2024-05-25 LAB — BASIC METABOLIC PANEL WITH GFR
BUN/Creatinine Ratio: 19 (ref 10–24)
BUN: 20 mg/dL (ref 8–27)
CO2: 23 mmol/L (ref 20–29)
Calcium: 10.6 mg/dL — ABNORMAL HIGH (ref 8.6–10.2)
Chloride: 104 mmol/L (ref 96–106)
Creatinine, Ser: 1.03 mg/dL (ref 0.76–1.27)
Glucose: 161 mg/dL — ABNORMAL HIGH (ref 70–99)
Potassium: 4 mmol/L (ref 3.5–5.2)
Sodium: 140 mmol/L (ref 134–144)
eGFR: 83 mL/min/1.73

## 2024-05-27 ENCOUNTER — Ambulatory Visit: Payer: Self-pay | Admitting: Cardiology

## 2024-05-27 NOTE — Telephone Encounter (Signed)
 Pharmacy Patient Advocate Encounter  Received notification from [employer] that Prior Authorization for wegovy has been DENIED.  Full denial letter will be uploaded to the media tab. See denial reason below.  PA #/Case ID/Reference #: [id number] No hx of MI nor CVA nor PAD

## 2024-05-30 DIAGNOSIS — L03115 Cellulitis of right lower limb: Secondary | ICD-10-CM | POA: Diagnosis not present

## 2024-05-30 DIAGNOSIS — F17210 Nicotine dependence, cigarettes, uncomplicated: Secondary | ICD-10-CM | POA: Diagnosis not present

## 2024-05-30 DIAGNOSIS — G4733 Obstructive sleep apnea (adult) (pediatric): Secondary | ICD-10-CM | POA: Diagnosis not present

## 2024-05-30 DIAGNOSIS — J449 Chronic obstructive pulmonary disease, unspecified: Secondary | ICD-10-CM | POA: Diagnosis not present

## 2024-06-05 ENCOUNTER — Other Ambulatory Visit: Payer: Self-pay

## 2024-06-05 ENCOUNTER — Ambulatory Visit: Payer: Medicare (Managed Care) | Attending: Cardiology

## 2024-06-05 ENCOUNTER — Other Ambulatory Visit: Payer: Self-pay | Admitting: Cardiology

## 2024-06-05 DIAGNOSIS — R0600 Dyspnea, unspecified: Secondary | ICD-10-CM

## 2024-06-05 DIAGNOSIS — I5033 Acute on chronic diastolic (congestive) heart failure: Secondary | ICD-10-CM

## 2024-06-05 LAB — ECHOCARDIOGRAM COMPLETE
Area-P 1/2: 4.45 cm2
S' Lateral: 3.1 cm

## 2024-06-05 MED ORDER — CEPHALEXIN 500 MG PO CAPS: 500.0000 mg | ORAL_CAPSULE | Freq: Two times a day (BID) | ORAL | 0 refills | Status: DC

## 2024-06-05 NOTE — Progress Notes (Signed)
 Pt arrived for echo, rash on lower legs R>L as well as lower abdomen, and axilla with urticaria.   Will d/c Losartan, Dr. Monetta evaluated feels could be reaction to losartan.   Start Keflex 500 mg PO twice daily x 7 day.   D/C doxycycline.   D/C lasix , check BMET, plan to start spiro in case allergy to lasix .

## 2024-06-06 ENCOUNTER — Telehealth: Payer: Self-pay

## 2024-06-06 ENCOUNTER — Ambulatory Visit: Payer: Self-pay | Admitting: Cardiology

## 2024-06-06 DIAGNOSIS — I10 Essential (primary) hypertension: Secondary | ICD-10-CM

## 2024-06-06 DIAGNOSIS — M79661 Pain in right lower leg: Secondary | ICD-10-CM

## 2024-06-06 LAB — BASIC METABOLIC PANEL WITH GFR
BUN/Creatinine Ratio: 21 (ref 10–24)
BUN: 21 mg/dL (ref 8–27)
CO2: 22 mmol/L (ref 20–29)
Calcium: 10.5 mg/dL — ABNORMAL HIGH (ref 8.6–10.2)
Chloride: 103 mmol/L (ref 96–106)
Creatinine, Ser: 0.98 mg/dL (ref 0.76–1.27)
Glucose: 92 mg/dL (ref 70–99)
Potassium: 4.2 mmol/L (ref 3.5–5.2)
Sodium: 140 mmol/L (ref 134–144)
eGFR: 88 mL/min/1.73

## 2024-06-06 MED ORDER — SPIRONOLACTONE 25 MG PO TABS: 25.0000 mg | ORAL_TABLET | Freq: Every day | ORAL | 3 refills | Status: AC

## 2024-06-06 NOTE — Telephone Encounter (Signed)
 ABI, Lower extremity arterial duplex ordered. Per Leroy Hoover, NP

## 2024-06-06 NOTE — Telephone Encounter (Signed)
 Lower Extremity Arterail Duplex and ABI per Delon Hoover, NP. Pt made aware.

## 2024-06-06 NOTE — Telephone Encounter (Signed)
 Results reviewed with pt as per Delon Hoover, NP's note.  Pt verbalized understanding and had no additional questions. Routed to PCP

## 2024-06-10 ENCOUNTER — Telehealth: Payer: Self-pay | Admitting: Cardiology

## 2024-06-10 NOTE — Telephone Encounter (Signed)
 FYI patient states he's gained 4 pounds in the last few days. CB # [phone]

## 2024-06-11 ENCOUNTER — Ambulatory Visit: Payer: Self-pay | Admitting: Cardiology

## 2024-06-11 LAB — BASIC METABOLIC PANEL WITH GFR
BUN/Creatinine Ratio: 15 (ref 10–24)
BUN: 16 mg/dL (ref 8–27)
CO2: 21 mmol/L (ref 20–29)
Calcium: 10 mg/dL (ref 8.6–10.2)
Chloride: 106 mmol/L (ref 96–106)
Creatinine, Ser: 1.04 mg/dL (ref 0.76–1.27)
Glucose: 123 mg/dL — ABNORMAL HIGH (ref 70–99)
Potassium: 4.3 mmol/L (ref 3.5–5.2)
Sodium: 142 mmol/L (ref 134–144)
eGFR: 82 mL/min/1.73

## 2024-06-11 NOTE — Progress Notes (Unsigned)
 Cardiology Office Note:    Date:  06/12/2024   ID:  Leroy Hart, DOB [birth year], MRN [id number]  PCP:  Leroy Angeline FALCON, NP  Cardiologist:  Leroy Leiter, MD    Referring MD: Leroy Angeline FALCON, NP    ASSESSMENT:    1. Hypertensive heart disease, unspecified whether heart failure present   2. Coronary artery disease of native artery of native heart with stable angina pectoris   3. Mixed hyperlipidemia   4. Centrilobular emphysema (HCC)   5. 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  Concerns regarding medicines are outlined above.  No orders of the defined types were placed in this encounter.  No orders of the defined types were placed in this encounter.    History of Present Illness:    Leroy Hart is a 63 y.o. male with a hx of CAD with PCI and stenting right coronary artery September 2022 hypertensive heart disease hyperlipidemia and emphysema last seen 10/30/2023.  An echocardiogram performed 06/05/2024 because of edema and concern for decompensated heart failure left ventricle normal in size mild LVH normal ejection fraction reduced GLS and was described as normal diastolic function right ventricle normal size and function and no valvular regurgitation.  Compliance with diet, lifestyle and medications:  Yes  I know Leroy Hart's family very well.  His mother is present participates in evaluation decision making He has developed very severe lower extremity edema quite typical of heart failure but very atypical and that he has normal left ventricular filling pressures and a BNP level below the lower cutoff which eliminates the possibility of heart failure He does not have liver disease does not have severe kidney disease or proteinuria but he tells me he now has severe obstructive sleep apnea is waiting to start CPAP He finds that he is more short of breath and also has pain in the right leg and more edema in the right thigh than the left thigh. He has no known history of venous thromboembolism He is due for venous duplex I will move it up to tomorrow check labs on him again today including a D-dimer and repeat BMP To try to alleviate his peripheral edema until he gets the benefits of CPAP he will stop his calcium  channel blocker really favors edema and I will put him back on a loop diuretic along with spironolactone. Past Medical History:  Diagnosis Date   Abnormal cardiac CT angiography 05/10/2021   Angina, class III 05/10/2021   Centrilobular emphysema (HCC) 03/23/2021   Formatting of this note might be different from the original.  Chest CT 01/21/2021     Chronic fatigue    Cigarette smoker 03/23/2021   Closed fracture of distal end of left fibula with routine healing 02/03/2016   Coronary artery disease of native artery of native heart with stable angina pectoris  Dyspnea on exertion 03/23/2021   Essential hypertension    Hardening of the aorta (main artery of the heart) 02/01/2022   Hyperlipidemia 02/01/2022   OSA (obstructive sleep apnea) 05/04/2021   Formatting of this note might be different from the original.  HST 05/03/2021, AHI 8.0     Status post angioplasty with stent 02/01/2022   05/10/2021     Testicular hypofunction    Vitamin D deficiency     Current Medications: Current  Meds  Medication Sig   albuterol (VENTOLIN HFA) 108 (90 Base) MCG/ACT inhaler Inhale 2 puffs into the lungs every 6 (six) hours as needed.   clopidogrel  (PLAVIX ) 75 MG tablet Take 1 tablet (75 mg total) by mouth daily.   diltiazem  (CARDIZEM  CD) 240 MG 24 hr capsule Take 1 capsule (240 mg total) by mouth daily.   losartan (COZAAR) 100 MG tablet Take 100 mg by mouth daily.   nitroGLYCERIN  (NITROSTAT ) 0.4 MG SL tablet Place 1 tablet (0.4 mg total) under the tongue every 5 (five) minutes as needed for chest pain.   rosuvastatin  (CRESTOR ) 20 MG tablet Take 1 tablet (20 mg total) by mouth daily.   semaglutide-weight management (WEGOVY) 0.25 MG/0.5ML SOAJ SQ injection Inject 0.25 mg into the skin once a week for 28 days.   [START ON 06/22/2024] semaglutide-weight management (WEGOVY) 0.5 MG/0.5ML SOAJ SQ injection Inject 0.5 mg into the skin once a week for 28 days.   [START ON 07/21/2024] semaglutide-weight management (WEGOVY) 1 MG/0.5ML SOAJ SQ injection Inject 1 mg into the skin once a week for 28 days.   [START ON 08/19/2024] semaglutide-weight management (WEGOVY) 1.7 MG/0.75ML SOAJ SQ injection Inject 1.7 mg into the skin once a week for 28 days.   [START ON 09/17/2024] semaglutide-weight management (WEGOVY) 2.4 MG/0.75ML SOAJ SQ injection Inject 2.4 mg into the skin once a week for 28 days.   spironolactone (ALDACTONE) 25 MG tablet Take 1 tablet (25 mg total) by mouth daily.   TRELEGY ELLIPTA 100-62.5-25 MCG/ACT AEPB Inhale 1 puff into the lungs daily.   Varenicline Tartrate, Starter, (CHANTIX STARTING MONTH PAK) 0.5 MG X 11 & 1 MG X 42 TBPK Take 1 tablet by mouth as directed.   Vitamin D3 (VITAMIN D) 25 MCG tablet Take 1,000 Units by mouth daily.   [DISCONTINUED] cephALEXin (KEFLEX) 500 MG capsule Take 1 capsule (500 mg total) by mouth 2 (two) times daily for 7 days.      EKGs/Labs/Other Studies Reviewed:    The following studies were reviewed today:  Cardiac Studies & Procedures    ______________________________________________________________________________________________ CARDIAC CATHETERIZATION  CARDIAC CATHETERIZATION 05/10/2021  Conclusion   Culprit Lesion Segment: Prox RCA lesion is 65% stenosed.  Prox RCA to Mid RCA lesion is 80% stenosed.   A drug-eluting stent was successfully placed using a SYNERGY XD 2.50X48. ->  Postdilated 2.75 mm   Post intervention, there is a 0% residual stenosis throughout the stented segment.SABRA   ------------------------------------------------------------   Prox LAD to Mid LAD lesion is 20% stenosed with 40% stenosed side branch in 1st Diag.   Mid LAD lesion is 60% stenosed with 35% stenosed side branch in 2nd Diag. - (RFR 0.91, FFR 0.82 --> Borderline, consistent with CT FFR)   Dist Cx lesion is 40% stenosed with 40% stenosed side branch in 3rd Mrg.   ------------------------------------------------------------   The left ventricular systolic function is normal.  The left ventricular ejection fraction is 55-65% by visual estimate.  LV end diastolic pressure is normal   There is  no aortic valve stenosis.  SUMMARY Two-vessel CAD Severe tandem lesions in the proximal to mid RCA (65% and 80%) lesions with documented positivity by CT FFR Successful DES PCI covering both lesions (using a single Synergy DES 2.5 mm x 48 mm postdilated to 2.75 mm.) Moderate mid LAD stenosis (CT FFR estimated 0.82) -> RFR 0.91-0.92, FFR 0.82 showing consistent borderline/nonischemic findings.  (Treat medically) Minimal disease in the AV groove circumflex Relatively normal LVEF with normal EDP.   RECOMMENDATIONS Discharge to home after PACU -plan same-day discharge-6 hours DAPT x6 months uninterrupted, then continue Plavix  monotherapy for at least 2 total years given the extent of RCA stent and LAD disease Increase rosuvastatin  40 mg daily and continue other home medications -> currently on diltiazem  as opposed to beta-blocker, will defer to primary  cardiologist   Alm Clay, MD  Findings Coronary Findings Diagnostic  Dominance: Right  Left Main Vessel was injected. Vessel is normal in caliber. Vessel is angiographically normal.  Left Anterior Descending Prox LAD to Mid LAD lesion is 20% stenosed with 40% stenosed side branch in 1st Diag. The lesion is located at the bifurcation. Mid LAD lesion is 60% stenosed with 35% stenosed side branch in 2nd Diag. Vessel is not the culprit lesion. The lesion is located at the bend, focal and eccentric. At major branch Pressure wire/FFR was performed on the lesion. FFR: 0.82. Pressure X wire: RFR 0.91-0.92. RFR/FFR: XB LAD 3.5 guide catheter -> pressure X wire; adenosine  infused at 140 mcg/kg/min x2 min End Result-Borderline, Nonischemic -> recommend medical management  Second Diagonal Branch Vessel is small in size.  Left Circumflex Vessel is large. There is mild diffuse disease throughout the vessel. Dist Cx lesion is 40% stenosed with 40% stenosed side branch in 3rd Mrg. The lesion is located at the bifurcation.  First Obtuse Marginal Branch Vessel is moderate in size.  Second Obtuse Marginal Branch Vessel is moderate in size The vessel exhibits minimal luminal irregularities.  Third Obtuse Marginal Branch Vessel is moderate in size Vessel is angiographically normal.  First Left Posterolateral Branch Vessel is moderate in size.  Second Left Posterolateral Branch Vessel is moderate in size.  Left Posterior Atrioventricular Artery Vessel is moderate in size.  Right Coronary Artery Vessel was injected. Vessel is normal in caliber. There is mild diffuse disease throughout the vessel. There is severe focal disease in the vessel. Prox RCA lesion is 65% stenosed. The lesion is eccentric. Prox RCA to Mid RCA lesion is 80% stenosed. The lesion is segmental, eccentric and irregular.  Acute Marginal Branch Vessel is small in size.  Right Ventricular Branch Vessel is small in  size.  Right Posterior Descending Artery Vessel is small in size.  First Right Posterolateral Branch Vessel is small in size.  Intervention  Prox RCA lesion Stent (Also treats lesions: Prox RCA to Mid RCA) Lesion length:  46 mm. CATH VISTA GUIDE 6FR JR4 guide catheter was inserted. Lesion crossed with guidewire using a WIRE ASAHI PROWATER 180CM. Pre-stent angioplasty was performed using a BALLN SAPPHIRE 2.25X15. Maximum pressure:  8 atm. Inflation time:  20 sec. 3 total inflations A drug-eluting stent was successfully placed using a SYNERGY XD 2.50X48. Maximum pressure: 14 atm. Inflation time: 30 sec. Stent strut is well apposed. Postdilated to 2.75 mm Post-stent angioplasty was performed. Maximum pressure:  18 atm. Inflation time:  20 sec. Stent balloon, high ATM Post-Intervention Lesion Assessment The intervention was successful. Pre-interventional TIMI flow is 3. Post-intervention TIMI flow is 3. Treated lesion length:  48 mm. There is a 0% residual stenosis post intervention.  Prox RCA to Mid RCA lesion Stent (Also treats lesions: Prox RCA) See details in Prox RCA lesion. Post-Intervention Lesion Assessment The intervention was successful. Pre-interventional TIMI flow is 3. Post-intervention TIMI flow is 3. Treated lesion length:  48 mm. No complications occurred at this lesion. There is a 0% residual stenosis post intervention.     ECHOCARDIOGRAM  ECHOCARDIOGRAM COMPLETE 06/05/2024  Narrative ECHOCARDIOGRAM REPORT    Patient Name:   Leroy Hart Date of Exam: 06/05/2024 Medical Rec #:  [id number]               Height:       69.0 in Accession #:    [id number]              Weight:       284.8 lb Date of Birth:  20-Jun-1961               BSA:          2.401 m Patient Age:    63 years                BP:           112/58 mmHg Patient Gender: M                       HR:           81 bpm. Exam Location:  [place]  Procedure: 2D Echo, Cardiac Doppler, Color Doppler  and Strain Analysis (Both Spectral and Color Flow Doppler were utilized during procedure).  Indications:    Dyspnea, unspecified type [R06.00]  History:        Patient has prior history of Echocardiogram examinations, most recent 06/02/2021. CAD, COPD, Signs/Symptoms:Shortness of Breath and Edema; Risk Factors:Hypertension, Dyslipidemia and Current Smoker.  Sonographer:    CHARLIE Jointer RDCS Referring Phys: [phone] DELON JAYSON HOOVER   Sonographer Comments: Image acquisition challenging due to patient body habitus. IMPRESSIONS   1. Left ventricular ejection fraction, by estimation, is 60 to 65%. The left ventricle has normal function. The left ventricle has no regional wall motion abnormalities. There is mild left ventricular hypertrophy. Left ventricular diastolic parameters were normal. The average left ventricular global longitudinal strain is -15.0 %. The global longitudinal strain is abnormal. 2. Right ventricular systolic function is normal. The right ventricular size is normal. 3. The mitral valve is normal in structure. No evidence of mitral valve regurgitation. No evidence of mitral stenosis. 4. The aortic valve is normal in structure. Aortic valve regurgitation is not visualized. No aortic stenosis is present. 5. The inferior vena cava is normal in size with greater than 50% respiratory variability, suggesting right atrial pressure of 3 mmHg.  FINDINGS Left Ventricle: Left ventricular ejection fraction, by estimation, is 60 to 65%. The left ventricle has normal function. The left ventricle has no regional wall motion abnormalities. The average left ventricular global longitudinal strain is -15.0 %. Strain was performed and the global longitudinal strain is abnormal. The left ventricular internal cavity size was normal in size. There is mild left ventricular hypertrophy. Left ventricular diastolic parameters were normal.  Right Ventricle: The right ventricular size is normal. No  increase in right ventricular wall thickness. Right ventricular systolic function is normal.  Left Atrium: Left atrial size was normal in size.  Right Atrium: Right atrial size was normal in size.  Pericardium: There is no evidence of pericardial effusion.  Mitral Valve: The mitral valve is normal in structure. No evidence of mitral valve regurgitation. No evidence of mitral valve stenosis.  Tricuspid Valve: The tricuspid valve is normal in structure. Tricuspid valve regurgitation is not demonstrated. No evidence of tricuspid stenosis.  Aortic Valve: The aortic valve is normal in structure. Aortic valve regurgitation is not visualized. No aortic stenosis is present.  Pulmonic Valve: The pulmonic valve was normal in structure. Pulmonic valve regurgitation is not visualized. No evidence of pulmonic stenosis.  Aorta: The aortic root is normal in size and structure.  Venous: The inferior vena cava is normal in size with greater than 50% respiratory variability, suggesting right atrial pressure of 3 mmHg.  IAS/Shunts: No atrial level shunt detected by color flow Doppler.   LEFT VENTRICLE PLAX 2D LVIDd:         4.90 cm   Diastology LVIDs:         3.10 cm   LV e' medial:    8.81 cm/s LV PW:         1.20 cm   LV E/e' medial:  9.1 LV IVS:        1.20 cm   LV e' lateral:   7.78 cm/s LVOT diam:     2.00 cm   LV E/e' lateral: 10.3 LV SV:         58 LV SV Index:   24        2D Longitudinal Strain LVOT Area:     3.14 cm  2D Strain GLS Avg:     -15.0 %   RIGHT VENTRICLE RV S prime:     13.87 cm/s TAPSE (M-mode): 2.6 cm  LEFT ATRIUM             Index LA diam:        4.00 cm 1.67 cm/m LA Vol (A2C):   46.6 ml 19.41 ml/m LA Vol (A4C):   46.4 ml 19.33 ml/m LA Biplane Vol: 47.6 ml 19.83 ml/m AORTIC VALVE LVOT Vmax:   101.07 cm/s LVOT Vmean:  65.133 cm/s LVOT VTI:    0.185 m  AORTA Ao Root diam: 3.60 cm Ao Asc diam:  3.10 cm Ao Desc diam: 3.00 cm  MITRAL VALVE MV Area (PHT):  4.45 cm    SHUNTS MV Decel Time: 171 msec    Systemic VTI:  0.18 m MV E velocity: 80.45 cm/s  Systemic Diam: 2.00 cm MV A velocity: 80.45 cm/s MV E/A ratio:  1.00  Lamar Fitch MD Electronically signed by Lamar Fitch MD Signature Date/Time: 06/05/2024/11:59:11 AM    Final      CT SCANS  CT CORONARY FRACTIONAL FLOW RESERVE DATA PREP 05/03/2021  Narrative EXAM: FFRCT ANALYSIS  FINDINGS: FFRct analysis was performed on the original cardiac CT angiogram dataset. Diagrammatic representation of the FFRct analysis is provided in a separate PDF document in PACS. This dictation was created using the PDF document and an interactive 3D model of the results. 3D model is not available in the EMR/PACS. Normal FFR range is >0.80.  1. LM: 0.99  2. LAD: Prox 0.94, mid (distal from the lesion) 0.82, distal 0.72 3. LCX: Prox 0.94, mid 0.85, distal 0.79 4. RCA: Prox 0.99, mid (distal from the lesion) 0.79, distal 0.76  IMPRESSION: Lesion seen in mid LAD is hemodynamically not significant.  Lesion noted in mid portion of RCA has borderline hemodynamic characteristic.  If pt is symptomatic consider cardiac catheterization.   Electronically Signed By: Lamar Fitch M.D. On: 05/03/2021 18:01  CT SCANS  CT CORONARY MORPH W/CTA COR W/SCORE 04/30/2021  Addendum 04/30/2021  6:26 PM ADDENDUM REPORT: 04/30/2021 18:23  CLINICAL DATA:  Atypical chestpain  EXAM: Cardiac/Coronary  CTA  TECHNIQUE: The patient was scanned on a [employer].  FINDINGS: A 120 kV prospective scan was triggered in the descending thoracic aorta at 111 HU's. Axial non-contrast 3 mm slices were carried out through the heart. The data set was analyzed on a dedicated work station and scored using the Agatson method. Gantry rotation speed was 250 msecs and collimation was .6 mm. No beta blockade and 0.8 mg of sl NTG was given. The 3D data set was reconstructed in 5% intervals of the  67-82 % of the R-R cycle. Diastolic phases were analyzed on a dedicated work station using MPR, MIP and VRT modes. The patient received 80 cc of contrast.  Aorta: Mildly enlarged ascending aorta - 38 mm. No calcifications. No dissection.  Aortic Valve:  Trileaflet.  No calcifications.  Coronary Arteries:  Normal coronary origin.  Right dominance.  RCA is a large dominant artery that gives rise to PDA and PLA. There is soft moderate (50-70% stenosis), plaque in the mid portion of RCA  Left main is a moderate size artery that gives rise to LAD, LCX arteries as well as to small intermediate branch.  LAD is a large vessel. There is a calcified plaque that extends into left main coronary artery. This plaque create moderate stenosis (50-70%) of the proximal portion of the LAD. In the mid portion of LAD small minimal (0-24% stenosis) non obstructive calcified plaque is noted. LAD gives rise to moderate size D1. D1 has mild calcified, non obstructive plaques. Small D2 is noted and is free of disease.  LCX is a non-dominant artery that gives rise to moderate size OM1 branch as well as moderate size OM2. There are soft, mild (25-49%) non obstructive plaques noted in mid portion of CX  Other findings:  Normal pulmonary vein drainage into the left atrium.  Normal left atrial appendage without a thrombus.  Normal size of the pulmonary artery.  IMPRESSION: 1. Coronary calcium  score: Not performed.  2. Normal coronary origin with right dominance.  3. CAD-RADS 3. Moderate stenosis. Consider symptom-guided anti-ischemic pharmacotherapy as well as risk factor modification per guideline directed care. Additional analysis with CT FFR will be submitted.   Electronically Signed By: Lamar Fitch M.D. On: 04/30/2021 18:23  Narrative EXAM: OVER-READ INTERPRETATION  CT CHEST  The following report is an over-read performed by radiologist Dr. Rockey Kilts of [facility]  on 04/30/2021. This over-read does not include interpretation of cardiac or coronary anatomy or pathology. The coronary CTA interpretation by the cardiologist is attached.  COMPARISON:  01/21/2021 CTA chest  FINDINGS: Vascular: Aortic atherosclerosis. No central pulmonary embolism, on this non-dedicated study.  Mediastinum/Nodes: No imaged thoracic adenopathy.  Lungs/Pleura: No pleural fluid. No lobar consolidation. Bilateral subpleural predominant pulmonary nodules including up to 6 mm are similar back to 01/19/2021.  Upper Abdomen: Normal imaged portions of the liver, spleen, stomach.  Musculoskeletal: No acute osseous abnormality.  IMPRESSION: 1.  No acute findings in the imaged extracardiac chest. 2.  Aortic Atherosclerosis (ICD10-I70.0). 3. Bilateral pulmonary nodules are subpleural predominant and similar back for 3 months. Per Fleischner criteria, follow-up chest CT at 15-21 months is considered optional for low risk patients, but recommended for high risk patients. This recommendation follows the consensus statement: Guidelines for Management of Incidental Pulmonary Nodules Detected on CT Images:From the  Fleischner Society 2017; published online before print (10.1148/radiol.[id number]).  Electronically Signed: By: Rockey Kilts M.D. On: 04/30/2021 13:25     ______________________________________________________________________________________________          Recent Labs: 05/10/2024: ALT 29; Hemoglobin 15.1; NT-Pro BNP <36; Platelets 111 06/10/2024: BUN 16; Creatinine, Ser 1.04; Potassium 4.3; Sodium 142  Recent Lipid Panel    Component Value Date/Time   CHOL 144 10/22/2021 0815   TRIG 211 (H) 10/22/2021 0815   HDL 36 (L) 10/22/2021 0815   CHOLHDL 4.0 10/22/2021 0815   LDLCALC 73 10/22/2021 0815    Physical Exam:    VS:  BP (!) 176/80   Pulse 92   Ht 5' 9 (1.753 m)   Wt 296 lb (134.3 kg)   SpO2 94%   BMI 43.71 kg/m     Wt Readings from Last 3  Encounters:  06/12/24 296 lb (134.3 kg)  05/24/24 284 lb 12.8 oz (129.2 kg)  05/13/24 283 lb 6.4 oz (128.5 kg)     GEN: He has profound lower extremity edema which extends up to the umbilicus pitting tense well nourished, well developed in no acute distress HEENT: Normal NECK: No JVD; No carotid bruits LYMPHATICS: No lymphadenopathy CARDIAC: RRR, no murmurs, rubs, gallops RESPIRATORY:  Clear to auscultation without rales, wheezing or rhonchi  ABDOMEN: Soft, non-tender, non-distended MUSCULOSKELETAL:  No edema; No deformity  SKIN: Warm and dry NEUROLOGIC:  Alert and oriented x 3 PSYCHIATRIC:  Normal affect    Signed, Leroy Leiter, MD  06/12/2024 3:15 PM    [facility]

## 2024-06-11 NOTE — Telephone Encounter (Signed)
 I spoke with patient and gave him my chart message.  Message also sent to patient though my chart. I advised patient ED is recommended due to his symptoms.  Patient reports he is not going to go to the ED

## 2024-06-12 ENCOUNTER — Ambulatory Visit: Payer: Medicare (Managed Care) | Attending: Cardiology | Admitting: Cardiology

## 2024-06-12 ENCOUNTER — Encounter: Payer: Self-pay | Admitting: Cardiology

## 2024-06-12 VITALS — BP 176/80 | HR 92 | Ht 69.0 in | Wt 296.0 lb

## 2024-06-12 DIAGNOSIS — I119 Hypertensive heart disease without heart failure: Secondary | ICD-10-CM

## 2024-06-12 DIAGNOSIS — E782 Mixed hyperlipidemia: Secondary | ICD-10-CM

## 2024-06-12 DIAGNOSIS — I25118 Atherosclerotic heart disease of native coronary artery with other forms of angina pectoris: Secondary | ICD-10-CM | POA: Diagnosis not present

## 2024-06-12 DIAGNOSIS — E66813 Obesity, class 3: Secondary | ICD-10-CM | POA: Diagnosis not present

## 2024-06-12 DIAGNOSIS — J432 Centrilobular emphysema: Secondary | ICD-10-CM

## 2024-06-12 DIAGNOSIS — Z6841 Body Mass Index (BMI) 40.0 and over, adult: Secondary | ICD-10-CM

## 2024-06-12 MED ORDER — TORSEMIDE 20 MG PO TABS: 20.0000 mg | ORAL_TABLET | Freq: Two times a day (BID) | ORAL | 3 refills | Status: DC

## 2024-06-12 NOTE — Addendum Note (Signed)
 Addended by: SHERRE ADE I on: 06/12/2024 03:41 PM   Modules accepted: Orders

## 2024-06-12 NOTE — Patient Instructions (Addendum)
 Medication Instructions:  Your physician has recommended you make the following change in your medication:   START: Torsemide 20 mg two times daily STOP: Cardizem   *If you need a refill on your cardiac medications before your next appointment, please call your pharmacy*  Lab Work: Your physician recommends that you return for lab work in:   Labs today: BMP, Magnesium, D-dimer  If you have labs (blood work) drawn today and your tests are completely normal, you will receive your results only by: MyChart Message (if you have MyChart) OR A paper copy in the mail If you have any lab test that is abnormal or we need to change your treatment, we will call you to review the results.  Testing/Procedures: Your physician has requested that you have a lower or upper extremity venous duplex. This test is an ultrasound of the veins in the legs or arms. It looks at venous blood flow that carries blood from the heart to the legs or arms. Allow one hour for a Lower Venous exam. Allow thirty minutes for an Upper Venous exam. There are no restrictions or special instructions.  Please note: We ask at that you not bring children with you during ultrasound (echo/ vascular) testing. Due to room size and safety concerns, children are not allowed in the ultrasound rooms during exams. Our front office staff cannot provide observation of children in our lobby area while testing is being conducted. An adult accompanying a patient to their appointment will only be allowed in the ultrasound room at the discretion of the ultrasound technician under special circumstances. We apologize for any inconvenience.   Follow-Up: At [facility], you and your health needs are our priority.  As part of our continuing mission to provide you with exceptional heart care, our providers are all part of one team.  This team includes your primary Cardiologist (physician) and Advanced Practice Providers or APPs (Physician Assistants  and Nurse Practitioners) who all work together to provide you with the care you need, when you need it.  Your next appointment:   3 month(s)  Provider:   Redell Leiter, MD    We recommend signing up for the patient portal called MyChart.  Sign up information is provided on this After Visit Summary.  MyChart is used to connect with patients for Virtual Visits (Telemedicine).  Patients are able to view lab/test results, encounter notes, upcoming appointments, etc.  Non-urgent messages can be sent to your provider as well.   To learn more about what you can do with MyChart, go to [email or web address].   Other Instructions None

## 2024-06-13 ENCOUNTER — Encounter: Payer: Self-pay | Admitting: Cardiology

## 2024-06-13 ENCOUNTER — Telehealth: Payer: Self-pay

## 2024-06-13 ENCOUNTER — Other Ambulatory Visit: Payer: Self-pay

## 2024-06-13 ENCOUNTER — Ambulatory Visit: Payer: Medicare (Managed Care) | Attending: Cardiology

## 2024-06-13 ENCOUNTER — Ambulatory Visit: Payer: Self-pay

## 2024-06-13 DIAGNOSIS — Z6841 Body Mass Index (BMI) 40.0 and over, adult: Secondary | ICD-10-CM

## 2024-06-13 DIAGNOSIS — I25118 Atherosclerotic heart disease of native coronary artery with other forms of angina pectoris: Secondary | ICD-10-CM | POA: Diagnosis not present

## 2024-06-13 DIAGNOSIS — I119 Hypertensive heart disease without heart failure: Secondary | ICD-10-CM

## 2024-06-13 DIAGNOSIS — J432 Centrilobular emphysema: Secondary | ICD-10-CM | POA: Diagnosis not present

## 2024-06-13 DIAGNOSIS — R600 Localized edema: Secondary | ICD-10-CM

## 2024-06-13 DIAGNOSIS — E66813 Obesity, class 3: Secondary | ICD-10-CM

## 2024-06-13 DIAGNOSIS — E782 Mixed hyperlipidemia: Secondary | ICD-10-CM

## 2024-06-13 DIAGNOSIS — R0600 Dyspnea, unspecified: Secondary | ICD-10-CM

## 2024-06-13 LAB — BASIC METABOLIC PANEL WITH GFR
BUN/Creatinine Ratio: 14 (ref 10–24)
BUN: 15 mg/dL (ref 8–27)
CO2: 21 mmol/L (ref 20–29)
Calcium: 10.4 mg/dL — ABNORMAL HIGH (ref 8.6–10.2)
Chloride: 105 mmol/L (ref 96–106)
Creatinine, Ser: 1.08 mg/dL (ref 0.76–1.27)
Glucose: 114 mg/dL — ABNORMAL HIGH (ref 70–99)
Potassium: 4 mmol/L (ref 3.5–5.2)
Sodium: 143 mmol/L (ref 134–144)
eGFR: 78 mL/min/1.73 (ref >59)

## 2024-06-13 LAB — MAGNESIUM: Magnesium: 2.3 mg/dL (ref 1.6–2.3)

## 2024-06-13 LAB — D-DIMER, QUANTITATIVE: D-DIMER: 1.08 mg{FEU}/L — ABNORMAL HIGH (ref 0.00–0.49)

## 2024-06-13 MED ORDER — METOPROLOL TARTRATE 100 MG PO TABS: 100.0000 mg | ORAL_TABLET | Freq: Once | ORAL | 0 refills | Status: DC

## 2024-06-13 NOTE — Telephone Encounter (Signed)
 These instructions  for his cardiac CTA were reviewed with the patient and the patient verbalized understanding and had no further questions at this time.     Your cardiac CT will be scheduled at one of the below locations:   [facility] [address] [place], [place] [address] [id number] (Severe contrast allergies only)  OR   [facility] [address] [place], [place] [address] [phone]  OR   [facility] [address] [place], [place] [address] [phone]  OR   [facility] [address]  [place], [place] [address]  OR   [facility] [address] [place], [place] [phone]  If scheduled at [facility], please arrive at the [facility] and Children's Entrance (Entrance C2) of [facility] 30 minutes prior to test start time. You can use the FREE valet parking offered at entrance C (encouraged to control the heart rate for the test)  Proceed to the [facility] Radiology Department (first floor) to check-in and test prep.  All radiology patients and guests should use entrance C2 at [facility], accessed from [facility], even though the hospital's physical address listed is [address].  If scheduled at the Heart and Vascular Tower at [address], please enter the parking lot using the [address] entrance and use the FREE valet service at the patient drop-off area. Enter the building and check-in with registration on the main floor.  If scheduled at [facility], please arrive to the Heart and Vascular Center 15 mins early for check-in and test prep.  There is spacious parking and easy access to the radiology department from the [facility] Heart and Vascular entrance. Please enter here and check-in with the desk attendant.   If scheduled at [facility], please arrive 30 minutes early for  check-in and test prep.  Please follow these instructions carefully (unless otherwise directed):  An IV will be required for this test and Nitroglycerin  will be given.  Hold all erectile dysfunction medications at least 3 days (72 hrs) prior to test. (Ie viagra, cialis, sildenafil, tadalafil, etc)   On the Night Before the Test: Be sure to Drink plenty of water. Do not consume any caffeinated/decaffeinated beverages or chocolate 12 hours prior to your test. Do not take any antihistamines 12 hours prior to your test.  On the Day of the Test: Drink plenty of water until 1 hour prior to the test. Do not eat any food 1 hour prior to test. You may take your regular medications prior to the test.  Take metoprolol  (Lopressor ) two hours prior to test. If you take Torsemide and Spironolactone please HOLD on the morning of the test. Patients who wear a continuous glucose monitor MUST remove the device prior to scanning.      After the Test: Drink plenty of water. After receiving IV contrast, you may experience a mild flushed feeling. This is normal. On occasion, you may experience a mild rash up to 24 hours after the test. This is not dangerous. If this occurs, you can take Benadryl 25 mg, Zyrtec, Claritin, or Allegra and increase your fluid intake. (Patients taking Tikosyn should avoid Benadryl, and may take Zyrtec, Claritin, or Allegra) If you experience trouble breathing, this can be serious. If it is severe call 911 IMMEDIATELY. If it is mild, please call our office.  We will call to schedule your test  2-4 weeks out understanding that some insurance companies will need an authorization prior to the service being performed.   For more information and frequently asked questions, please visit our website : [email or web address]  For non-scheduling related questions, please contact the cardiac imaging nurse navigator should you have any questions/concerns: Cardiac Imaging Nurse  Navigators Direct Office Dial: [phone]   For scheduling needs, including cancellations and rescheduling, please call [place], [phone].

## 2024-06-13 NOTE — Telephone Encounter (Signed)
 Patient came to the office to have a lower extremity ultrasound performed. While reviewing his instructions for his cardiac CTA he stated that he was very confused regarding which medications he should be taking. After reviewing his medications with him the only two medications we had questions about was Cartia  and Losartan. Patient would like to know if he should be taking his Cartia  and Losartan at this time.

## 2024-06-14 DIAGNOSIS — D519 Vitamin B12 deficiency anemia, unspecified: Secondary | ICD-10-CM | POA: Diagnosis not present

## 2024-06-14 DIAGNOSIS — R7309 Other abnormal glucose: Secondary | ICD-10-CM | POA: Diagnosis not present

## 2024-06-19 ENCOUNTER — Other Ambulatory Visit: Payer: Self-pay | Admitting: Emergency Medicine

## 2024-06-19 MED ORDER — TORSEMIDE 20 MG PO TABS: 20.0000 mg | ORAL_TABLET | Freq: Every day | ORAL | 3 refills | Status: AC

## 2024-07-01 ENCOUNTER — Other Ambulatory Visit: Payer: Self-pay

## 2024-07-02 NOTE — Progress Notes (Unsigned)
 Cardiology Office Note:    Date:  07/03/2024   ID:  Leroy Hart, DOB [birth year], MRN [id number]  PCP:  Silvano Angeline FALCON, NP  Cardiologist:  Redell Leiter, MD    Referring MD: Silvano Angeline FALCON, NP    ASSESSMENT:    1. Hypertensive heart disease, unspecified whether heart failure present   2. Coronary artery disease of native artery of native heart with stable angina pectoris   3. Mixed hyperlipidemia   4. Centrilobular emphysema (HCC)   5. OSA (obstructive sleep apnea)    PLAN:    In order of problems listed above:  Primary issue is severe obstructive sleep apnea still is not treated and he is going to reach out to his pulmonary physician's office for assistance in The interim he has been alleviated continue his current diuretic torsemide spironolactone we will recheck his potassium and renal function today Stable CAD continue his medical therapy clopidogrel  has high intensity statin Stable COPD   Next appointment: 3 months   Medication Adjustments/Labs and Tests Ordered: Current medicines are reviewed at length with the patient today.  Concerns regarding medicines are outlined above.  No orders of the defined types were placed in this encounter.  No orders of the defined types were placed in this encounter.    History of Present Illness:    Leroy Hart is a 63 y.o. male with a hx of CAD with PCI and stenting right coronary artery September 2022 hypertensive heart disease hyperlipidemia and emphysema last seen 06/12/2024.  Provide last seen he remains severely short of breath and edematous due to severe obstructive sleep apnea awaiting institution of CPAP therapy.  He was restarted on loop diuretic and spironolactone.  With complaints of leg pain he underwent duplex venous structures lower extremity bilateral which showed no finding of deep vein thrombosis he had enlarged lymph nodes in the groin.  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  Chest CT 01/21/2021     Chronic fatigue    Cigarette smoker 03/23/2021   Closed fracture of distal end of left fibula with routine healing 02/03/2016   Coronary artery disease of native artery of native heart with stable angina pectoris    Dyspnea on exertion 03/23/2021   Essential hypertension    Hardening of the aorta (main artery of the heart) 02/01/2022   Hyperlipidemia 02/01/2022   OSA (obstructive sleep apnea) 05/04/2021   Formatting of this note might be different from the original.  HST 05/03/2021, AHI 8.0     Status post angioplasty with stent 02/01/2022   05/10/2021     Testicular hypofunction    Vitamin D deficiency     Current Medications: No outpatient medications have been marked as  taking for the 07/03/24 encounter (Office Visit) with Monetta Redell PARAS, MD.      EKGs/Labs/Other Studies Reviewed:    The following studies were reviewed today:  Cardiac Studies & Procedures   ______________________________________________________________________________________________ CARDIAC CATHETERIZATION  CARDIAC CATHETERIZATION 05/10/2021  Conclusion   Culprit Lesion Segment: Prox RCA lesion is 65% stenosed.  Prox RCA to Mid RCA lesion is 80% stenosed.   A drug-eluting stent was  successfully placed using a SYNERGY XD 2.50X48. ->  Postdilated 2.75 mm   Post intervention, there is a 0% residual stenosis throughout the stented segment.SABRA   ------------------------------------------------------------   Prox LAD to Mid LAD lesion is 20% stenosed with 40% stenosed side branch in 1st Diag.   Mid LAD lesion is 60% stenosed with 35% stenosed side branch in 2nd Diag. - (RFR 0.91, FFR 0.82 --> Borderline, consistent with CT FFR)   Dist Cx lesion is 40% stenosed with 40% stenosed side branch in 3rd Mrg.   ------------------------------------------------------------   The left ventricular systolic function is normal.  The left ventricular ejection fraction is 55-65% by visual estimate.  LV end diastolic pressure is normal   There is no aortic valve stenosis.  SUMMARY Two-vessel CAD Severe tandem lesions in the proximal to mid RCA (65% and 80%) lesions with documented positivity by CT FFR Successful DES PCI covering both lesions (using a single Synergy DES 2.5 mm x 48 mm postdilated to 2.75 mm.) Moderate mid LAD stenosis (CT FFR estimated 0.82) -> RFR 0.91-0.92, FFR 0.82 showing consistent borderline/nonischemic findings.  (Treat medically) Minimal disease in the AV groove circumflex Relatively normal LVEF with normal EDP.   RECOMMENDATIONS Discharge to home after PACU -plan same-day discharge-6 hours DAPT x6 months uninterrupted, then continue Plavix  monotherapy for at least 2 total years given the extent of RCA stent and LAD disease Increase rosuvastatin  40 mg daily and continue other home medications -> currently on diltiazem  as opposed to beta-blocker, will defer to primary cardiologist   Alm Clay, MD  Findings Coronary Findings Diagnostic  Dominance: Right  Left Main Vessel was injected. Vessel is normal in caliber. Vessel is angiographically normal.  Left Anterior Descending Prox LAD to Mid LAD lesion is 20% stenosed with 40% stenosed side branch in 1st Diag.  The lesion is located at the bifurcation. Mid LAD lesion is 60% stenosed with 35% stenosed side branch in 2nd Diag. Vessel is not the culprit lesion. The lesion is located at the bend, focal and eccentric. At major branch Pressure wire/FFR was performed on the lesion. FFR: 0.82. Pressure X wire: RFR 0.91-0.92. RFR/FFR: XB LAD 3.5 guide catheter -> pressure X wire; adenosine  infused at 140 mcg/kg/min x2 min End Result-Borderline, Nonischemic -> recommend medical management  Second Diagonal Branch Vessel is small in size.  Left Circumflex Vessel is large. There is mild diffuse disease throughout the vessel. Dist Cx lesion is 40% stenosed with 40% stenosed side branch in 3rd Mrg. The lesion is located at the bifurcation.  First Obtuse Marginal Branch Vessel is moderate in size.  Second Obtuse Marginal Branch Vessel is moderate in size The vessel exhibits minimal luminal irregularities.  Third Obtuse Marginal Branch Vessel is moderate in size Vessel is angiographically normal.  First Left Posterolateral Branch Vessel is moderate in size.  Second Left Posterolateral Branch Vessel is moderate in size.  Left Posterior Atrioventricular Artery Vessel is moderate in size.  Right Coronary Artery Vessel was injected. Vessel is normal in caliber. There is mild diffuse disease throughout the vessel. There is  severe focal disease in the vessel. Prox RCA lesion is 65% stenosed. The lesion is eccentric. Prox RCA to Mid RCA lesion is 80% stenosed. The lesion is segmental, eccentric and irregular.  Acute Marginal Branch Vessel is small in size.  Right Ventricular Branch Vessel is small in size.  Right Posterior Descending Artery Vessel is small in size.  First Right Posterolateral Branch Vessel is small in size.  Intervention  Prox RCA lesion Stent (Also treats lesions: Prox RCA to Mid RCA) Lesion length:  46 mm. CATH VISTA GUIDE 6FR JR4 guide catheter was inserted. Lesion crossed with  guidewire using a WIRE ASAHI PROWATER 180CM. Pre-stent angioplasty was performed using a BALLN SAPPHIRE 2.25X15. Maximum pressure:  8 atm. Inflation time:  20 sec. 3 total inflations A drug-eluting stent was successfully placed using a SYNERGY XD 2.50X48. Maximum pressure: 14 atm. Inflation time: 30 sec. Stent strut is well apposed. Postdilated to 2.75 mm Post-stent angioplasty was performed. Maximum pressure:  18 atm. Inflation time:  20 sec. Stent balloon, high ATM Post-Intervention Lesion Assessment The intervention was successful. Pre-interventional TIMI flow is 3. Post-intervention TIMI flow is 3. Treated lesion length:  48 mm. There is a 0% residual stenosis post intervention.  Prox RCA to Mid RCA lesion Stent (Also treats lesions: Prox RCA) See details in Prox RCA lesion. Post-Intervention Lesion Assessment The intervention was successful. Pre-interventional TIMI flow is 3. Post-intervention TIMI flow is 3. Treated lesion length:  48 mm. No complications occurred at this lesion. There is a 0% residual stenosis post intervention.     ECHOCARDIOGRAM  ECHOCARDIOGRAM COMPLETE 06/05/2024  Narrative ECHOCARDIOGRAM REPORT    Patient Name:   Leroy Hart Date of Exam: 06/05/2024 Medical Rec #:  [id number]               Height:       69.0 in Accession #:    [id number]              Weight:       284.8 lb Date of Birth:  1961/06/07               BSA:          2.401 m Patient Age:    63 years                BP:           112/58 mmHg Patient Gender: M                       HR:           81 bpm. Exam Location:  [place]  Procedure: 2D Echo, Cardiac Doppler, Color Doppler and Strain Analysis (Both Spectral and Color Flow Doppler were utilized during procedure).  Indications:    Dyspnea, unspecified type [R06.00]  History:        Patient has prior history of Echocardiogram examinations, most recent 06/02/2021. CAD, COPD, Signs/Symptoms:Shortness of Breath and Edema; Risk  Factors:Hypertension, Dyslipidemia and Current Smoker.  Sonographer:    Leroy Jointer RDCS Referring Phys: [phone] DELON JAYSON HOOVER   Sonographer Comments: Image acquisition challenging due to patient body habitus. IMPRESSIONS   1. Left ventricular ejection fraction, by estimation, is 60 to 65%. The left ventricle has normal function. The left ventricle has no regional wall motion abnormalities. There is mild left ventricular hypertrophy. Left ventricular diastolic parameters were normal. The average left ventricular global longitudinal strain is -15.0 %. The global longitudinal strain  is abnormal. 2. Right ventricular systolic function is normal. The right ventricular size is normal. 3. The mitral valve is normal in structure. No evidence of mitral valve regurgitation. No evidence of mitral stenosis. 4. The aortic valve is normal in structure. Aortic valve regurgitation is not visualized. No aortic stenosis is present. 5. The inferior vena cava is normal in size with greater than 50% respiratory variability, suggesting right atrial pressure of 3 mmHg.  FINDINGS Left Ventricle: Left ventricular ejection fraction, by estimation, is 60 to 65%. The left ventricle has normal function. The left ventricle has no regional wall motion abnormalities. The average left ventricular global longitudinal strain is -15.0 %. Strain was performed and the global longitudinal strain is abnormal. The left ventricular internal cavity size was normal in size. There is mild left ventricular hypertrophy. Left ventricular diastolic parameters were normal.  Right Ventricle: The right ventricular size is normal. No increase in right ventricular wall thickness. Right ventricular systolic function is normal.  Left Atrium: Left atrial size was normal in size.  Right Atrium: Right atrial size was normal in size.  Pericardium: There is no evidence of pericardial effusion.  Mitral Valve: The mitral valve is normal in  structure. No evidence of mitral valve regurgitation. No evidence of mitral valve stenosis.  Tricuspid Valve: The tricuspid valve is normal in structure. Tricuspid valve regurgitation is not demonstrated. No evidence of tricuspid stenosis.  Aortic Valve: The aortic valve is normal in structure. Aortic valve regurgitation is not visualized. No aortic stenosis is present.  Pulmonic Valve: The pulmonic valve was normal in structure. Pulmonic valve regurgitation is not visualized. No evidence of pulmonic stenosis.  Aorta: The aortic root is normal in size and structure.  Venous: The inferior vena cava is normal in size with greater than 50% respiratory variability, suggesting right atrial pressure of 3 mmHg.  IAS/Shunts: No atrial level shunt detected by color flow Doppler.   LEFT VENTRICLE PLAX 2D LVIDd:         4.90 cm   Diastology LVIDs:         3.10 cm   LV e' medial:    8.81 cm/s LV PW:         1.20 cm   LV E/e' medial:  9.1 LV IVS:        1.20 cm   LV e' lateral:   7.78 cm/s LVOT diam:     2.00 cm   LV E/e' lateral: 10.3 LV SV:         58 LV SV Index:   24        2D Longitudinal Strain LVOT Area:     3.14 cm  2D Strain GLS Avg:     -15.0 %   RIGHT VENTRICLE RV S prime:     13.87 cm/s TAPSE (M-mode): 2.6 cm  LEFT ATRIUM             Index LA diam:        4.00 cm 1.67 cm/m LA Vol (A2C):   46.6 ml 19.41 ml/m LA Vol (A4C):   46.4 ml 19.33 ml/m LA Biplane Vol: 47.6 ml 19.83 ml/m AORTIC VALVE LVOT Vmax:   101.07 cm/s LVOT Vmean:  65.133 cm/s LVOT VTI:    0.185 m  AORTA Ao Root diam: 3.60 cm Ao Asc diam:  3.10 cm Ao Desc diam: 3.00 cm  MITRAL VALVE MV Area (PHT): 4.45 cm    SHUNTS MV Decel Time: 171 msec    Systemic VTI:  0.18 m MV E velocity: 80.45 cm/s  Systemic Diam: 2.00 cm MV A velocity: 80.45 cm/s MV E/A ratio:  1.00  Lamar Fitch MD Electronically signed by Lamar Fitch MD Signature Date/Time: 06/05/2024/11:59:11 AM    Final      CT  SCANS  CT CORONARY FRACTIONAL FLOW RESERVE DATA PREP 05/03/2021  Narrative EXAM: FFRCT ANALYSIS  FINDINGS: FFRct analysis was performed on the original cardiac CT angiogram dataset. Diagrammatic representation of the FFRct analysis is provided in a separate PDF document in PACS. This dictation was created using the PDF document and an interactive 3D model of the results. 3D model is not available in the EMR/PACS. Normal FFR range is >0.80.  1. LM: 0.99  2. LAD: Prox 0.94, mid (distal from the lesion) 0.82, distal 0.72 3. LCX: Prox 0.94, mid 0.85, distal 0.79 4. RCA: Prox 0.99, mid (distal from the lesion) 0.79, distal 0.76  IMPRESSION: Lesion seen in mid LAD is hemodynamically not significant.  Lesion noted in mid portion of RCA has borderline hemodynamic characteristic.  If pt is symptomatic consider cardiac catheterization.   Electronically Signed By: Lamar Fitch M.D. On: 05/03/2021 18:01   CT SCANS  CT CORONARY MORPH W/CTA COR W/SCORE 04/30/2021  Addendum 04/30/2021  6:26 PM ADDENDUM REPORT: 04/30/2021 18:23  CLINICAL DATA:  Atypical chestpain  EXAM: Cardiac/Coronary  CTA  TECHNIQUE: The patient was scanned on a [employer].  FINDINGS: A 120 kV prospective scan was triggered in the descending thoracic aorta at 111 HU's. Axial non-contrast 3 mm slices were carried out through the heart. The data set was analyzed on a dedicated work station and scored using the Agatson method. Gantry rotation speed was 250 msecs and collimation was .6 mm. No beta blockade and 0.8 mg of sl NTG was given. The 3D data set was reconstructed in 5% intervals of the 67-82 % of the R-R cycle. Diastolic phases were analyzed on a dedicated work station using MPR, MIP and VRT modes. The patient received 80 cc of contrast.  Aorta: Mildly enlarged ascending aorta - 38 mm. No calcifications. No dissection.  Aortic Valve:  Trileaflet.  No calcifications.  Coronary  Arteries:  Normal coronary origin.  Right dominance.  RCA is a large dominant artery that gives rise to PDA and PLA. There is soft moderate (50-70% stenosis), plaque in the mid portion of RCA  Left main is a moderate size artery that gives rise to LAD, LCX arteries as well as to small intermediate branch.  LAD is a large vessel. There is a calcified plaque that extends into left main coronary artery. This plaque create moderate stenosis (50-70%) of the proximal portion of the LAD. In the mid portion of LAD small minimal (0-24% stenosis) non obstructive calcified plaque is noted. LAD gives rise to moderate size D1. D1 has mild calcified, non obstructive plaques. Small D2 is noted and is free of disease.  LCX is a non-dominant artery that gives rise to moderate size OM1 branch as well as moderate size OM2. There are soft, mild (25-49%) non obstructive plaques noted in mid portion of CX  Other findings:  Normal pulmonary vein drainage into the left atrium.  Normal left atrial appendage without a thrombus.  Normal size of the pulmonary artery.  IMPRESSION: 1. Coronary calcium  score: Not performed.  2. Normal coronary origin with right dominance.  3. CAD-RADS 3. Moderate stenosis. Consider symptom-guided anti-ischemic pharmacotherapy as well as risk factor modification per guideline directed care. Additional analysis with CT  FFR will be submitted.   Electronically Signed By: Lamar Fitch M.D. On: 04/30/2021 18:23  Narrative EXAM: OVER-READ INTERPRETATION  CT CHEST  The following report is an over-read performed by radiologist Dr. Rockey Kilts of [facility] on 04/30/2021. This over-read does not include interpretation of cardiac or coronary anatomy or pathology. The coronary CTA interpretation by the cardiologist is attached.  COMPARISON:  01/21/2021 CTA chest  FINDINGS: Vascular: Aortic atherosclerosis. No central pulmonary embolism, on this  non-dedicated study.  Mediastinum/Nodes: No imaged thoracic adenopathy.  Lungs/Pleura: No pleural fluid. No lobar consolidation. Bilateral subpleural predominant pulmonary nodules including up to 6 mm are similar back to 01/19/2021.  Upper Abdomen: Normal imaged portions of the liver, spleen, stomach.  Musculoskeletal: No acute osseous abnormality.  IMPRESSION: 1.  No acute findings in the imaged extracardiac chest. 2.  Aortic Atherosclerosis (ICD10-I70.0). 3. Bilateral pulmonary nodules are subpleural predominant and similar back for 3 months. Per Fleischner criteria, follow-up chest CT at 15-21 months is considered optional for low risk patients, but recommended for high risk patients. This recommendation follows the consensus statement: Guidelines for Management of Incidental Pulmonary Nodules Detected on CT Images:From the Fleischner Society 2017; published online before print (10.1148/radiol.7982838340).  Electronically Signed: By: Rockey Kilts M.D. On: 04/30/2021 13:25     ______________________________________________________________________________________________          Recent Labs: 05/10/2024: ALT 29; Hemoglobin 15.1; NT-Pro BNP <36; Platelets 111 06/12/2024: BUN 15; Creatinine, Ser 1.08; Magnesium 2.3; Potassium 4.0; Sodium 143  Recent Lipid Panel    Component Value Date/Time   CHOL 144 10/22/2021 0815   TRIG 211 (H) 10/22/2021 0815   HDL 36 (L) 10/22/2021 0815   CHOLHDL 4.0 10/22/2021 0815   LDLCALC 73 10/22/2021 0815    Physical Exam:    VS:  BP (!) 148/88   Pulse 90   Ht 5' 9 (1.753 m)   Wt 286 lb 3.2 oz (129.8 kg)   SpO2 94%   BMI 42.26 kg/m     Wt Readings from Last 3 Encounters:  07/03/24 286 lb 3.2 oz (129.8 kg)  06/12/24 296 lb (134.3 kg)  05/24/24 284 lb 12.8 oz (129.2 kg)     GEN: Obes well nourished, well developed in no acute distress HEENT: Normal NECK: No JVD; No carotid bruits LYMPHATICS: No lymphadenopathy CARDIAC: Much  less lower extremity edema RRR, no murmurs, rubs, gallops RESPIRATORY:  Clear to auscultation without rales, wheezing or rhonchi  ABDOMEN: Soft, non-tender, non-distended MUSCULOSKELETAL:  No deformity  SKIN: Warm and dry NEUROLOGIC:  Alert and oriented x 3 PSYCHIATRIC:  Normal affect    Signed, Redell Leiter, MD  07/03/2024 1:25 PM    [facility]

## 2024-07-03 ENCOUNTER — Encounter: Payer: Self-pay | Admitting: Cardiology

## 2024-07-03 ENCOUNTER — Ambulatory Visit: Payer: Medicare (Managed Care) | Attending: Cardiology | Admitting: Cardiology

## 2024-07-03 VITALS — BP 148/88 | HR 90 | Ht 69.0 in | Wt 286.2 lb

## 2024-07-03 DIAGNOSIS — I119 Hypertensive heart disease without heart failure: Secondary | ICD-10-CM | POA: Diagnosis not present

## 2024-07-03 DIAGNOSIS — J432 Centrilobular emphysema: Secondary | ICD-10-CM | POA: Diagnosis not present

## 2024-07-03 DIAGNOSIS — E782 Mixed hyperlipidemia: Secondary | ICD-10-CM | POA: Diagnosis not present

## 2024-07-03 DIAGNOSIS — G4733 Obstructive sleep apnea (adult) (pediatric): Secondary | ICD-10-CM

## 2024-07-03 DIAGNOSIS — I25118 Atherosclerotic heart disease of native coronary artery with other forms of angina pectoris: Secondary | ICD-10-CM

## 2024-07-03 NOTE — Patient Instructions (Signed)
 Medication Instructions:  Your physician recommends that you continue on your current medications as directed. Please refer to the Current Medication list given to you today.  *If you need a refill on your cardiac medications before your next appointment, please call your pharmacy*  Lab Work: Your physician recommends that you return for lab work in:   Labs today: BMP, Pro BNP  If you have labs (blood work) drawn today and your tests are completely normal, you will receive your results only by: MyChart Message (if you have MyChart) OR A paper copy in the mail If you have any lab test that is abnormal or we need to change your treatment, we will call you to review the results.  Testing/Procedures: None  Follow-Up: At [facility], you and your health needs are our priority.  As part of our continuing mission to provide you with exceptional heart care, our providers are all part of one team.  This team includes your primary Cardiologist (physician) and Advanced Practice Providers or APPs (Physician Assistants and Nurse Practitioners) who all work together to provide you with the care you need, when you need it.  Your next appointment:   3 month(s)  Provider:   Redell Leiter, MD    We recommend signing up for the patient portal called MyChart.  Sign up information is provided on this After Visit Summary.  MyChart is used to connect with patients for Virtual Visits (Telemedicine).  Patients are able to view lab/test results, encounter notes, upcoming appointments, etc.  Non-urgent messages can be sent to your provider as well.   To learn more about what you can do with MyChart, go to [email or web address].   Other Instructions None

## 2024-07-04 ENCOUNTER — Encounter: Payer: Self-pay | Admitting: Cardiology

## 2024-07-04 ENCOUNTER — Ambulatory Visit: Payer: Medicare (Managed Care)

## 2024-07-04 LAB — PRO B NATRIURETIC PEPTIDE: NT-Pro BNP: <36 pg/mL (ref 0–210)

## 2024-07-04 LAB — BASIC METABOLIC PANEL WITH GFR
BUN/Creatinine Ratio: 26 — ABNORMAL HIGH (ref 10–24)
BUN: 25 mg/dL (ref 8–27)
CO2: 22 mmol/L (ref 20–29)
Calcium: 10.5 mg/dL — ABNORMAL HIGH (ref 8.6–10.2)
Chloride: 102 mmol/L (ref 96–106)
Creatinine, Ser: 0.96 mg/dL (ref 0.76–1.27)
Glucose: 99 mg/dL (ref 70–99)
Potassium: 4.5 mmol/L (ref 3.5–5.2)
Sodium: 139 mmol/L (ref 134–144)
eGFR: 90 mL/min/1.73 (ref >59)

## 2024-07-11 ENCOUNTER — Ambulatory Visit (INDEPENDENT_AMBULATORY_CARE_PROVIDER_SITE_OTHER)
Admission: RE | Admit: 2024-07-11 | Discharge: 2024-07-11 | Disposition: A | Discharge status: Home / Self Care | Payer: Medicare (Managed Care) | Source: Ambulatory Visit | Attending: Cardiology | Admitting: Cardiology

## 2024-07-11 DIAGNOSIS — R0600 Dyspnea, unspecified: Secondary | ICD-10-CM | POA: Diagnosis not present

## 2024-07-11 DIAGNOSIS — I2510 Atherosclerotic heart disease of native coronary artery without angina pectoris: Secondary | ICD-10-CM | POA: Diagnosis not present

## 2024-07-11 MED ORDER — IOHEXOL 350 MG/ML SOLN
125.0000 mL | Freq: Once | INTRAVENOUS | Status: AC | PRN
  Administered 2024-07-11: 125 mL via INTRAVENOUS

## 2024-07-11 MED ORDER — NITROGLYCERIN 0.4 MG SL SUBL
0.8000 mg | SUBLINGUAL_TABLET | Freq: Once | SUBLINGUAL | Status: AC
  Administered 2024-07-11: 0.8 mg via SUBLINGUAL

## 2024-07-12 ENCOUNTER — Inpatient Hospital Stay (INDEPENDENT_AMBULATORY_CARE_PROVIDER_SITE_OTHER)
Admission: RE | Admit: 2024-07-12 | Discharge: 2024-07-12 | Disposition: A | Discharge status: Home / Self Care | Payer: Medicare (Managed Care) | Source: Ambulatory Visit | Attending: Cardiology | Admitting: Cardiology

## 2024-07-12 ENCOUNTER — Other Ambulatory Visit: Payer: Self-pay | Admitting: Cardiology

## 2024-07-12 ENCOUNTER — Ambulatory Visit: Payer: Self-pay | Admitting: Cardiology

## 2024-07-12 DIAGNOSIS — R931 Abnormal findings on diagnostic imaging of heart and coronary circulation: Secondary | ICD-10-CM | POA: Diagnosis not present

## 2024-07-12 NOTE — Progress Notes (Signed)
 FFR order

## 2024-07-31 NOTE — Telephone Encounter (Signed)
 SABRA

## 2024-09-18 ENCOUNTER — Telehealth: Payer: Self-pay | Admitting: Cardiology

## 2024-09-18 NOTE — Telephone Encounter (Signed)
 Pcp Dr called and would like patient to receive a 2 D Echo due to rash on chest and Localized swelling in patient chest going down his right arm and shoulder. Pcp also stated telangiectasia in the chest area please advise

## 2024-09-19 ENCOUNTER — Other Ambulatory Visit: Payer: Self-pay

## 2024-09-19 DIAGNOSIS — I82621 Acute embolism and thrombosis of deep veins of right upper extremity: Secondary | ICD-10-CM

## 2024-09-19 NOTE — Telephone Encounter (Signed)
 Called the patient and informed him that Dr. Monetta spoke with his PCP regarding which test he needed to have done. A right upper arm venous ultrasound was ordered via Epic and scheduled for 7:30 am on 09/20/24. Patient was made aware of the appointment time and date. Patient had no further questions at this time.

## 2024-09-20 ENCOUNTER — Ambulatory Visit: Payer: Medicare (Managed Care) | Attending: Cardiology

## 2024-09-20 ENCOUNTER — Ambulatory Visit: Payer: Medicare (Managed Care)

## 2024-09-20 DIAGNOSIS — I82621 Acute embolism and thrombosis of deep veins of right upper extremity: Secondary | ICD-10-CM | POA: Diagnosis not present

## 2024-10-03 ENCOUNTER — Ambulatory Visit: Payer: Medicare (Managed Care) | Admitting: Cardiology
